# Patient Record
Sex: Female | Born: 1980
Health system: Southern US, Community
[De-identification: ages and names within clinical notes are randomized; demographics above are authoritative.]

## PROBLEM LIST (undated history)

## (undated) DIAGNOSIS — J45909 Unspecified asthma, uncomplicated: Secondary | ICD-10-CM

## (undated) DIAGNOSIS — N200 Calculus of kidney: Secondary | ICD-10-CM

## (undated) DIAGNOSIS — G43909 Migraine, unspecified, not intractable, without status migrainosus: Secondary | ICD-10-CM

## (undated) DIAGNOSIS — E669 Obesity, unspecified: Secondary | ICD-10-CM

## (undated) DIAGNOSIS — Z8759 Personal history of other complications of pregnancy, childbirth and the puerperium: Secondary | ICD-10-CM

## (undated) HISTORY — DX: Migraine, unspecified, not intractable, without status migrainosus: G43.909

## (undated) HISTORY — DX: Personal history of other complications of pregnancy, childbirth and the puerperium: Z87.59

---

## 2004-11-07 DIAGNOSIS — Z8759 Personal history of other complications of pregnancy, childbirth and the puerperium: Secondary | ICD-10-CM

## 2004-11-07 HISTORY — PX: SALPINGOOPHORECTOMY: SHX82

## 2004-11-07 HISTORY — DX: Personal history of other complications of pregnancy, childbirth and the puerperium: Z87.59

## 2005-08-01 ENCOUNTER — Encounter: Payer: Self-pay | Admitting: Emergency Medicine

## 2005-08-01 ENCOUNTER — Inpatient Hospital Stay (HOSPITAL_COMMUNITY): Admission: AD | Admit: 2005-08-01 | Discharge: 2005-08-03 | Payer: Self-pay | Admitting: Gynecology

## 2005-08-01 ENCOUNTER — Encounter (INDEPENDENT_AMBULATORY_CARE_PROVIDER_SITE_OTHER): Payer: Self-pay | Admitting: *Deleted

## 2006-02-16 ENCOUNTER — Emergency Department (HOSPITAL_COMMUNITY): Admission: EM | Admit: 2006-02-16 | Discharge: 2006-02-16 | Payer: Self-pay | Admitting: Emergency Medicine

## 2006-03-01 ENCOUNTER — Emergency Department (HOSPITAL_COMMUNITY): Admission: EM | Admit: 2006-03-01 | Discharge: 2006-03-01 | Payer: Self-pay | Admitting: Emergency Medicine

## 2006-07-24 ENCOUNTER — Emergency Department (HOSPITAL_COMMUNITY): Admission: EM | Admit: 2006-07-24 | Discharge: 2006-07-24 | Payer: Self-pay | Admitting: Emergency Medicine

## 2006-10-13 ENCOUNTER — Emergency Department (HOSPITAL_COMMUNITY): Admission: EM | Admit: 2006-10-13 | Discharge: 2006-10-13 | Payer: Self-pay | Admitting: Emergency Medicine

## 2007-01-02 ENCOUNTER — Emergency Department (HOSPITAL_COMMUNITY): Admission: EM | Admit: 2007-01-02 | Discharge: 2007-01-02 | Payer: Self-pay | Admitting: Emergency Medicine

## 2007-01-05 ENCOUNTER — Emergency Department (HOSPITAL_COMMUNITY): Admission: EM | Admit: 2007-01-05 | Discharge: 2007-01-06 | Payer: Self-pay | Admitting: Emergency Medicine

## 2007-08-14 ENCOUNTER — Emergency Department (HOSPITAL_COMMUNITY): Admission: EM | Admit: 2007-08-14 | Discharge: 2007-08-14 | Payer: Self-pay | Admitting: Emergency Medicine

## 2007-10-24 ENCOUNTER — Emergency Department (HOSPITAL_COMMUNITY): Admission: EM | Admit: 2007-10-24 | Discharge: 2007-10-24 | Payer: Self-pay | Admitting: *Deleted

## 2008-09-27 ENCOUNTER — Emergency Department (HOSPITAL_COMMUNITY): Admission: EM | Admit: 2008-09-27 | Discharge: 2008-09-27 | Payer: Self-pay | Admitting: Emergency Medicine

## 2009-10-09 ENCOUNTER — Emergency Department (HOSPITAL_COMMUNITY): Admission: EM | Admit: 2009-10-09 | Discharge: 2009-10-09 | Payer: Self-pay | Admitting: Emergency Medicine

## 2009-12-22 ENCOUNTER — Ambulatory Visit: Payer: Self-pay | Admitting: Family Medicine

## 2009-12-22 DIAGNOSIS — G43009 Migraine without aura, not intractable, without status migrainosus: Secondary | ICD-10-CM | POA: Insufficient documentation

## 2009-12-22 DIAGNOSIS — F17200 Nicotine dependence, unspecified, uncomplicated: Secondary | ICD-10-CM

## 2010-03-01 ENCOUNTER — Ambulatory Visit: Payer: Self-pay | Admitting: Family Medicine

## 2010-03-01 ENCOUNTER — Encounter: Payer: Self-pay | Admitting: Family Medicine

## 2010-03-01 ENCOUNTER — Other Ambulatory Visit: Admission: RE | Admit: 2010-03-01 | Discharge: 2010-03-01 | Payer: Self-pay | Admitting: Family Medicine

## 2010-03-01 LAB — CONVERTED CEMR LAB
ALT: 25 units/L (ref 0–35)
CO2: 23 meq/L (ref 19–32)
Calcium: 8.8 mg/dL (ref 8.4–10.5)
Cholesterol: 149 mg/dL (ref 0–200)
Glucose, Bld: 97 mg/dL (ref 70–99)
HDL: 56 mg/dL (ref >39)
Potassium: 4.1 meq/L (ref 3.5–5.3)
Sodium: 139 meq/L (ref 135–145)
Total Bilirubin: 0.5 mg/dL (ref 0.3–1.2)
Triglycerides: 47 mg/dL (ref <150)
VLDL: 9 mg/dL (ref 0–40)

## 2010-03-22 ENCOUNTER — Telehealth: Payer: Self-pay | Admitting: Family Medicine

## 2010-04-27 ENCOUNTER — Telehealth: Payer: Self-pay | Admitting: Family Medicine

## 2010-12-07 NOTE — Progress Notes (Signed)
Summary: phn msg  Phone Note Call from Patient Call back at Home Phone [phone]   Caller: Patient Summary of Call: pt has her daughter and wanted to talk about her weight. has her for the summer. Initial call taken by: De Nurse,  April 27, 2010 3:08 PM  Follow-up for Phone Call        She would like for her daughter to be seen.  She is illiterate and cannot complete the paper work.  WIll schedule with me and I will help her. Follow-up by: Luretha Murphy NP,  April 27, 2010 3:28 PM
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
## 2010-12-07 NOTE — Progress Notes (Signed)
Summary: test results  Phone Note Call from Patient Call back at Home Phone [phone]   Caller: Patient Summary of Call: IS WANTING TEST RESULTS Initial call taken by: De Nurse,  Mar 22, 2010 3:02 PM  Follow-up for Phone Call        told her all are fine. use condoms to keep it that way gave her other labs as well. told her they were fine as well pap next year Follow-up by: Golden Circle RN,  Mar 22, 2010 3:03 PM

## 2010-12-07 NOTE — Assessment & Plan Note (Signed)
Summary: CPE WITH PAP/KH   Vital Signs:  Patient profile:   30 year old female Height:      61.5 inches Weight:      162 pounds BMI:     30.22 Pulse rate:   58 / minute BP sitting:   124 / 79  (right arm)  Vitals Entered By: Renato Battles slade,cma CC: physical with pap. std check. Is Patient Diabetic? No Pain Assessment Patient in pain? no        CC:  physical with pap. std check..  History of Present Illness: Here for CPE and PAP, history of abnormal PAP in past.  Boyfriend for several years.  Callouses on bottom of feet, she removes with a razor, painful.  She walks a lot.  Has regular menses, tubes removed after tubal pregnancy.   Did not pick up Naprosyn because Medicaid did not cover.  Migranes have been less frequent.  Habits & Providers  Alcohol-Tobacco-Diet     Tobacco Status: current     Tobacco Counseling: to quit use of tobacco products  Comments: 3 cigars daily  Current Medications (verified): 1)  Ibuprofen 800 Mg Tabs (Ibuprofen) .... One At The Onset of A Ha and Q 8 Hours Prn  Allergies (verified): No Known Drug Allergies  Review of Systems GI:  Denies abdominal pain. GU:  Denies discharge and dysuria. Derm:  callouses and spots on feet.  Physical Exam  General:  In no diestress Eyes:  pupils equal, pupils round, pupils reactive to light, no injection, and no optic disk abnormalities.   Ears:  R ear normal and L ear normal.   Nose:  External nasal examination shows no deformity or inflammation. Nasal mucosa are pink and moist without lesions or exudates. Mouth:  pharynx pink and moist and poor dentition.   Neck:  No deformities, masses, or tenderness noted. Breasts:  No mass, nodules, thickening, tenderness, bulging, retraction, inflamation, nipple discharge or skin changes noted.   Lungs:  normal respiratory effort and normal breath sounds.   Heart:  normal rate and regular rhythm.   Abdomen:  soft, non-tender, normal bowel sounds, and no  masses.   Genitalia:  normal introitus, no external lesions, no vaginal discharge, mucosa pink and moist, no vaginal or cervical lesions, normal uterus size and position, and no adnexal masses or tenderness.   Skin:  dark spots on soles of feet, thick callouses on balls of both feet. Psych:  not anxious appearing and not depressed appearing.     Impression & Recommendations:  Problem # 1:  CONTACT OR EXPOSURE TO OTHER VIRAL DISEASES (ICD-V01.79) plantar rash check RPR Orders: GC/Chlamydia-[facility] ([id number]) HIV-[facility] ([address]) RPR-[facility] [phone]) [facility]- Est  Level 4 (29562)  Problem # 2:  SCREENING, DIABETES MELLITUS (ICD-V77.1)  Orders: Comp Met-[facility] (13086-57846)  Problem # 3:  SCREENING FOR MALIGNANT NEOPLASM OF THE CERVIX (ICD-V76.2)  Orders: Pap Smear-[facility] ([id number]) [facility]- Est  Level 4 (24401)  Problem # 4:  SCREENING OTHER&UNSPEC CARDIOVASCULAR CONDITIONS (ICD-V81.2)  Orders: Comp Met-[facility] ([id number]) Lipid-[facility] (03474-25956) [facility]- Est  Level 4 (38756)  Problem # 5:  TOBACCO ABUSE (ICD-305.1)  counseled  Orders: [facility]- Est  Level 4 ([address]329)  Complete Medication List: 1)  Ibuprofen 800 Mg Tabs (Ibuprofen) .... One at the onset of a ha and q 8 hours prn  Patient Instructions: 1)  keep feet soft with vasoline and pair down callouses regularly 2)  Ibuprofen 800 mg for migranes 3)  Please schedule a follow-up appointment in 6 months .  Prescriptions:  IBUPROFEN 800 MG TABS (IBUPROFEN) one at the onset of a HA and q 8 hours prn  #50 x 3   Entered and Authorized by:   Luretha Murphy NP   Signed by:   Luretha Murphy NP on 03/01/2010   Method used:   Electronically to        [facility] [address] [phone]* (retail)       [address] [place], [place]  [address]       Ph: [phone]       Fax: [phone]   RxID:   [id number]

## 2010-12-15 ENCOUNTER — Encounter: Payer: Self-pay | Admitting: *Deleted

## 2011-03-08 ENCOUNTER — Encounter: Payer: Self-pay | Admitting: Family Medicine

## 2011-03-08 ENCOUNTER — Ambulatory Visit (INDEPENDENT_AMBULATORY_CARE_PROVIDER_SITE_OTHER): Payer: Medicaid Other | Admitting: Family Medicine

## 2011-03-08 DIAGNOSIS — N760 Acute vaginitis: Secondary | ICD-10-CM | POA: Insufficient documentation

## 2011-03-08 DIAGNOSIS — J4520 Mild intermittent asthma, uncomplicated: Secondary | ICD-10-CM | POA: Insufficient documentation

## 2011-03-08 DIAGNOSIS — G43009 Migraine without aura, not intractable, without status migrainosus: Secondary | ICD-10-CM

## 2011-03-08 DIAGNOSIS — M545 Low back pain: Secondary | ICD-10-CM

## 2011-03-08 DIAGNOSIS — Z20828 Contact with and (suspected) exposure to other viral communicable diseases: Secondary | ICD-10-CM

## 2011-03-08 LAB — POCT WET PREP (WET MOUNT): Trichomonas Wet Prep HPF POC: NEGATIVE

## 2011-03-08 MED ORDER — CYCLOBENZAPRINE HCL 10 MG PO TABS: 10.0000 mg | ORAL_TABLET | Freq: Three times a day (TID) | ORAL | Status: AC | PRN

## 2011-03-08 MED ORDER — NAPROXEN SODIUM 220 MG PO TABS: 220.0000 mg | ORAL_TABLET | Freq: Two times a day (BID) | ORAL | Status: AC

## 2011-03-08 MED ORDER — ALBUTEROL SULFATE HFA 108 (90 BASE) MCG/ACT IN AERS: 2.0000 | INHALATION_SPRAY | Freq: Four times a day (QID) | RESPIRATORY_TRACT | Status: DC | PRN

## 2011-03-08 NOTE — Patient Instructions (Signed)
For you memory: you need to use your brain more, that means reading and recalling what your read. If you had Alzheimers (this does not happen to 30 year old) but you would not be able to learn anything new, like how to use a cell phone. Listen to the news and talk about it with your fiance You have had learning problems your whole life, so you have work harder that others to remember  Your back exam does not indicate any disc or nerve damage but you have muscle weakness and  Tenderness.   I have prescribed a muscle relaxant to use with your Aleve, it may make you sleepy, so use at night. I also want you to do the exercise that I gave you this will help  Headaches:  Lets try using the muscle relaxant at night.    Please try and drink more and eating healthy

## 2011-03-09 ENCOUNTER — Encounter: Payer: Self-pay | Admitting: Family Medicine

## 2011-03-09 LAB — GC/CHLAMYDIA PROBE AMP, GENITAL: GC Probe Amp, Genital: NEGATIVE

## 2011-03-09 MED ORDER — METRONIDAZOLE 500 MG PO TABS: 500.0000 mg | ORAL_TABLET | Freq: Two times a day (BID) | ORAL | Status: AC

## 2011-03-09 NOTE — Assessment & Plan Note (Signed)
Non reactive RPR (spots on feet) and HIV

## 2011-03-09 NOTE — Assessment & Plan Note (Signed)
Musculoskeletal, poor muscle tone.  Trail of Flexeril for both LBP and HA, as this is structurally similar to tricyclic antidepressants.

## 2011-03-09 NOTE — Assessment & Plan Note (Signed)
Refilled albuterol 

## 2011-03-09 NOTE — Progress Notes (Signed)
  Subjective:    Patient ID: Debra Washington, female    DOB: 1981-01-28, 30 y.o.   MRN: [id number]  HPI Debra Washington is here for a complete physical, she will be moving to [place] to live with her boyfriend.  She is a new patient to our clinic in the past year and this is her only second visit.  Review of electronic records from the hospital reveal a bilateral salpingectomy in 2006 after an ectopic pregnancy.    Her boyfriend called the clinic staff and is worried about her memory.  Debra Washington reports learning problems her whole life and in special classes.  She has very low reading and writing literacy.  She also complains of back pain, that sometimes keeps her awake at night.  She take 2 Aleve and it helps, she does not exercise.  She also reports odd remedies, such that she takes bleach baths' after her period. She uses alcohol in her vaginal area to get rid of smells.  She has a diet of processed foods and drinks sugar laden beverages.    Review of Systems  Constitutional: Negative for activity change, appetite change and fatigue.  HENT: Negative for rhinorrhea.   Eyes: Negative for visual disturbance.  Respiratory: Negative for cough.   Cardiovascular: Negative for chest pain and leg swelling.  Gastrointestinal: Positive for constipation.  Genitourinary: Positive for vaginal discharge. Negative for dysuria and pelvic pain.  Musculoskeletal: Positive for back pain.  Neurological: Positive for headaches.  Psychiatric/Behavioral: Positive for decreased concentration. Negative for sleep disturbance, dysphoric mood and agitation. The patient is not nervous/anxious.        Objective:   Physical Exam  Constitutional: She is oriented to person, place, and time.       Alert, able to remember 3 simple objects in 5 minutes, unable to read a patient education handout.  HENT:  Right Ear: External ear normal.  Left Ear: External ear normal.  Mouth/Throat: Oropharynx is clear and moist.  Neck:  Normal range of motion. No thyromegaly present.  Cardiovascular: Normal rate, regular rhythm and normal heart sounds.   Pulmonary/Chest: Effort normal and breath sounds normal.  Abdominal: Soft. Bowel sounds are normal.  Genitourinary: Uterus normal. Vaginal discharge found.  Musculoskeletal:       Good ROM of lumbar spine, complained of discomfort with hyperextension.  Negative straight leg raises, 5/5 strength +2 DTR.  Lymphadenopathy:    She has no cervical adenopathy.  Neurological: She is alert and oriented to person, place, and time. She has normal reflexes. No cranial nerve deficit. Coordination normal.  Skin: Skin is warm and dry. No rash noted.          Assessment & Plan:

## 2011-03-09 NOTE — Assessment & Plan Note (Signed)
Had a normal PAP last year, will defer to 2 year intervals.  + BV will treat with oral metronidazole.  Instructed not to use bleach or alcohol in her vagina.

## 2011-03-09 NOTE — Assessment & Plan Note (Signed)
Will continue NSAIDS and add muscle relaxant at night with tricyclic properties.

## 2011-03-09 NOTE — Assessment & Plan Note (Signed)
Counseled to quit 

## 2011-03-25 NOTE — Discharge Summary (Signed)
Debra Washington, Debra Washington             ACCOUNT NO.:  [id number]   MEDICAL RECORD NO.:  [id number]          PATIENT TYPE:  INP   LOCATION:  9306                          FACILITY:  [facility]   PHYSICIAN:  Timothy P. Fontaine, M.D.DATE OF BIRTH:  02-11-1981   DATE OF ADMISSION:  08/01/2005  DATE OF DISCHARGE:  08/03/2005                                 DISCHARGE SUMMARY   DISCHARGE DIAGNOSES:  Right tubal ectopic pregnancy.   PATHOLOGY:  [id number]  Right fallopian; fallopian tube with chorionic villi  consistent with ectopic pregnancy, complete transection of fallopian tube.  Left fallopian tube; complete transection of fallopian tube. No pathologic  abnormalities.   PROCEDURE:  Exploratory laparotomy, bilateral salpingectomy, lysis of pelvic  adhesions August 01, 2005 Dr. Reynaldo Minium.   HOSPITAL COURSE:  The patient was admitted for the above procedure August 01, 2005. Her postoperative course was uncomplicated. She was discharged on  postoperative day #2 ambulating well, tolerating a regular diet with a  postoperative hemoglobin at 10.5. The patient received a prescription for  Lortab 7.5/500 #30 one to two p.o. q. 4-6 hours p.r.n. pain and Reglan 10  milligrams #25 one p.o. q.6 hours p.r.n. nausea, vomiting. The patient  received precautions, instructions and follow-up and will be seen in the  office the Friday following discharge for staple removal. Her blood type was  A positive.      Timothy P. Fontaine, M.D.  Electronically Signed     TPF/MEDQ  D:  08/31/2005  T:  08/31/2005  Job:  [id number]

## 2011-03-25 NOTE — Op Note (Signed)
Debra Washington, Debra Washington             ACCOUNT NO.:  [id number]   MEDICAL RECORD NO.:  [id number]          PATIENT TYPE:  INP   LOCATION:  9306                          [facility]ILITY:  [facility]   PHYSICIAN:  Juan H. Lily Peer, M.D.DATE OF BIRTH:  [birth year]   DATE OF PROCEDURE:  08/01/2005  DATE OF DISCHARGE:                                 OPERATIVE REPORT   INDICATIONS:  A 30 year old, gravida 4, para 3, last menstrual period August  of 2006.  The patient with previous tubal sterilization procedure in [place], [place] in January of this year.  The patient with 1-2-day  history of right lower abdominal pain.  Positive pregnancy test in the  emergency room.  Ultrasound description suspicious for a right ruptured  ectopic pregnancy, quantitative beta hCG 290,000,000 international units per  mL.  The ultrasound demonstrated right cystic area in the right adnexa but  no intrauterine pregnancy.   PREOPERATIVE DIAGNOSES:  1.  Ruptured right ectopic pregnancy.  2.  Prior tubal sterilization procedure.   POSTOPERATIVE DIAGNOSES:  1.  Hemoperitoneum.  2.  Right ampullary ectopic.   ANESTHESIA:  General endotracheal anesthesia.   SURGEON:  Juan H. Lily Peer, M.D.   OPERATION/PROCEDURE:  1.  Exploratory laparotomy.  2.  Bilateral salpingectomy.  3.  Lysis of pelvic adhesions.   FINDINGS:  There was blood in the pelvic cavity. Bilateral peritubal  adhesions near the area of the right tube and ovary.  There was blood clots  suspicious for an extruded ampullary ectopic pregnancy.  Evidence of  bilateral tubal sterilization bilaterally.   DESCRIPTION OF PROCEDURE:  After the patient was adequately counseled, she  was taken to the operating room where she underwent general endotracheal  anesthesia.  The patient's abdomen was prepped and draped in the usual  sterile fashion.  Foley was inserted in an effort to monitor urinary output.  A Pfannenstiel skin incision was made 2 cm above the  symphysis pubis.  The  incision was carried down through the skin and subcutaneous tissue down to  the rectus fascia.  A midline nick was made.  The patient was incised in  transverse fashion. The peritoneal cavity was entered cautiously.  The  O'Connor-O'Sullivan retractor was in place.  After the patient had been  placed in Trendelenburg position.  Blood and blood clots were removed from  the pelvic cavity.  The pelvic cavity was irrigated with normal saline  solution for better evaluation.  It appeared that the patient had an  extruded ampullary ectopic pregnancy and evidence of bilateral tubal  transection was evident.  Both ovaries otherwise looked normal.  The uterine  serosa anterior and posteriorly looked normal.  The patient underwent  bilateral salpingectomies by removing both fallopian tubes and the  mesosalpinx had been secured with 3-0 Vicryl suture on both sides.  The  pelvic cavity was copiously irrigated with normal saline solution.  Interceed was placed over both salpingectomy sites for prevention of  additional adhesions.  Of note, the small paraovarian adhesions had to be  free from the tube in an effort complete the salpingectomies bilaterally.  After  sponge count and needle count were correct, the O'Connor-O'Sullivan  retractor was removed.  The pelvic cavity had previously been irrigated with  normal saline solution and the patient was received 1 g of Ancef for  prophylaxis.  The visceral peritoneum was not reapproximated but the rectus  fascia was closed with a running stitch of 0 Vicryl suture.  The  subcutaneous bleeders were Bovie cauterized. The skin was reapproximated  with skin clips followed by placement of Xeroform gauze and 4 x 8 dressing.  The patient was extubated and transferred to the recovery room with stable  vital signs.  Blood loss was 100 mL.  IV fluid was 3000 mL lactated  Ringer's.  Urine output 150 mL.  The patient's blood type is A  positive.      Juan H. Lily Peer, M.D.  Electronically Signed     JHF/MEDQ  D:  08/01/2005  T:  08/01/2005  Job:  [id number]

## 2012-05-31 ENCOUNTER — Emergency Department (HOSPITAL_COMMUNITY)
Admission: EM | Admit: 2012-05-31 | Discharge: 2012-05-31 | Disposition: A | Discharge status: Home / Self Care | Payer: No Typology Code available for payment source | Attending: Emergency Medicine | Admitting: Emergency Medicine

## 2012-05-31 ENCOUNTER — Encounter (HOSPITAL_COMMUNITY): Payer: Self-pay | Admitting: *Deleted

## 2012-05-31 DIAGNOSIS — F17200 Nicotine dependence, unspecified, uncomplicated: Secondary | ICD-10-CM | POA: Insufficient documentation

## 2012-05-31 DIAGNOSIS — S335XXA Sprain of ligaments of lumbar spine, initial encounter: Secondary | ICD-10-CM | POA: Insufficient documentation

## 2012-05-31 DIAGNOSIS — IMO0002 Reserved for concepts with insufficient information to code with codable children: Secondary | ICD-10-CM

## 2012-05-31 HISTORY — DX: Unspecified asthma, uncomplicated: J45.909

## 2012-05-31 LAB — URINALYSIS, ROUTINE W REFLEX MICROSCOPIC
Bilirubin Urine: NEGATIVE
Glucose, UA: NEGATIVE mg/dL
Hgb urine dipstick: NEGATIVE
Ketones, ur: NEGATIVE mg/dL
Protein, ur: NEGATIVE mg/dL
Urobilinogen, UA: 1 mg/dL (ref 0.0–1.0)

## 2012-05-31 LAB — PREGNANCY, URINE: Preg Test, Ur: NEGATIVE

## 2012-05-31 MED ORDER — IBUPROFEN 600 MG PO TABS: 600.0000 mg | ORAL_TABLET | Freq: Four times a day (QID) | ORAL | Status: AC | PRN

## 2012-05-31 MED ORDER — CYCLOBENZAPRINE HCL 10 MG PO TABS: 10.0000 mg | ORAL_TABLET | Freq: Two times a day (BID) | ORAL | Status: AC | PRN
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
## 2012-05-31 NOTE — ED Provider Notes (Signed)
Medical screening examination/treatment/procedure(s) were performed by non-physician practitioner and as supervising physician I was immediately available for consultation/collaboration.   Avrian Delfavero Y. Mao Lockner, MD 05/31/12 2324 

## 2012-05-31 NOTE — ED Notes (Signed)
Pt ambulated to and from RR

## 2012-05-31 NOTE — ED Notes (Signed)
Provider at bedside
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
## 2013-12-03 ENCOUNTER — Encounter (HOSPITAL_COMMUNITY): Payer: Self-pay | Admitting: Emergency Medicine

## 2013-12-03 ENCOUNTER — Emergency Department (HOSPITAL_COMMUNITY)
Admission: EM | Admit: 2013-12-03 | Discharge: 2013-12-03 | Disposition: A | Discharge status: Home / Self Care | Payer: Medicare Other | Attending: Emergency Medicine | Admitting: Emergency Medicine

## 2013-12-03 DIAGNOSIS — J45909 Unspecified asthma, uncomplicated: Secondary | ICD-10-CM | POA: Insufficient documentation

## 2013-12-03 DIAGNOSIS — Z8742 Personal history of other diseases of the female genital tract: Secondary | ICD-10-CM | POA: Insufficient documentation

## 2013-12-03 DIAGNOSIS — F17200 Nicotine dependence, unspecified, uncomplicated: Secondary | ICD-10-CM | POA: Insufficient documentation

## 2013-12-03 DIAGNOSIS — Z880 Allergy status to penicillin: Secondary | ICD-10-CM | POA: Insufficient documentation

## 2013-12-03 DIAGNOSIS — Z79899 Other long term (current) drug therapy: Secondary | ICD-10-CM | POA: Insufficient documentation

## 2013-12-03 DIAGNOSIS — R21 Rash and other nonspecific skin eruption: Secondary | ICD-10-CM | POA: Insufficient documentation

## 2013-12-03 MED ORDER — PREDNISONE 20 MG PO TABS
60.0000 mg | ORAL_TABLET | Freq: Once | ORAL | Status: AC
  Administered 2013-12-03: 60 mg via ORAL
  Filled 2013-12-03: qty 3

## 2013-12-03 MED ORDER — PERMETHRIN 5 % EX CREA: TOPICAL_CREAM | CUTANEOUS | Status: DC

## 2013-12-03 NOTE — ED Provider Notes (Signed)
CSN: [id number]     Arrival date & time 12/03/13  1818 History  This chart was scribed for non-physician practitioner Ivonne AndrewPeter Khalel Alms, PA-C working with Audree CamelScott T Goldston, MD by Valera CastleSteven Perry, ED scribe. This patient was seen in room WTR7/WTR7 and the patient's care was started at 9:19 PM.     Chief Complaint  Patient presents with  . Rash    The history is provided by the patient. No language interpreter was used.   HPI Comments: Debra Washington is a 33 y.o. female who presents to the Emergency Department complaining of an itchy rash over her upper back, neck, bilateral arms, hands fingers, onset a few days ago. She reports she just came down from [place] visiting family. She reports not eating much yesterday, doesn't think rash is due to something she ate. She reports staying with family at their house. She reports her son has a few bumps over his body as well. She reports she slept on couch, on sheets. She states son slept in a different room. She reports h/o sensitive skin. She reports having forgotten her dove soap, so she used sister's [place] soap. She reports her skin was burning when she used [place] soap. She reports having Benadryl at home, but not having used it. She denies h/o similar rash. She denies SOB, swelling of lips, mouth, tongue, and any other associated symptoms. She denies medical history.   PCP - No primary provider on file.  Past Medical History  Diagnosis Date  . Hx of ectopic pregnancy 2006  . Asthma    Past Surgical History  Procedure Laterality Date  . Salpingoophorectomy  2006   History reviewed. No pertinent family history. History  Substance Use Topics  . Smoking status: Current Every Day Smoker  . Smokeless tobacco: Not on file  . Alcohol Use: No   OB History   Grav Para Term Preterm Abortions TAB SAB Ect Mult Living                 Review of Systems  Constitutional: Negative for fever.  HENT: Negative for facial swelling.   Respiratory:  Negative for shortness of breath.   Skin: Positive for rash (upper back, neck, bilateral arms, hands, fingers).  All other systems reviewed and are negative.   Allergies  Penicillins  Home Medications   Current Outpatient Rx  Name  Route  Sig  Dispense  Refill  . albuterol (PROVENTIL HFA;VENTOLIN HFA) 108 (90 BASE) MCG/ACT inhaler   Inhalation   Inhale 2 puffs into the lungs every 4 (four) hours as needed for wheezing or shortness of breath. For shortness of breath         . cyclobenzaprine (FLEXERIL) 10 MG tablet   Oral   Take 10 mg by mouth daily as needed for muscle spasms (For back spasms).         Marland Kitchen. ibuprofen (ADVIL,MOTRIN) 800 MG tablet   Oral   Take 800-1,600 mg by mouth 2 (two) times daily as needed for mild pain or moderate pain.          BP 133/92  Pulse 79  Temp(Src) 97.6 F (36.4 C) (Oral)  Resp 16  SpO2 93%  Physical Exam  Nursing note and vitals reviewed. Constitutional: She is oriented to person, place, and time. She appears well-developed and well-nourished. No distress.  HENT:  Head: Normocephalic and atraumatic.  Eyes: EOM are normal.  Neck: Neck supple.  Cardiovascular: Normal rate, regular rhythm and normal heart sounds.  No murmur heard. Pulmonary/Chest: Effort normal. No respiratory distress. She has no wheezes. She has no rales.  Musculoskeletal: Normal range of motion.  Neurological: She is alert and oriented to person, place, and time.  Skin: Skin is warm and dry. Rash noted.  Grouped primarily papular rash over left upper chest, lower anterior neck, bilateral forearms and upper arms, and dorsal hands. Patient scratching at many areas.  Psychiatric: She has a normal mood and affect. Her behavior is normal.    ED Course  Procedures   DIAGNOSTIC STUDIES: Oxygen Saturation is 93% on room air, adequate by my interpretation.    COORDINATION OF CARE: 9:23 PM-Discussed treatment plan which includes allergic reaction symptoms with pt at  bedside and pt agreed to plan. Advised pt to f/u if symptoms worsen.     MDM   1. Rash       I personally performed the services described in this documentation, which was scribed in my presence. The recorded information has been reviewed and is accurate.    Angus Seller, PA-C 12/04/13 [phone]

## 2013-12-03 NOTE — ED Notes (Signed)
Pt reports rash on her body x2 days. Denies pain. Pt is from out of town. Thought it was the soap she has been using.

## 2013-12-03 NOTE — Discharge Instructions (Signed)
Continue to use Benadryl for her rash and itching. If you continue to have symptoms or have worsening rash you may consider using the cream that was prescribed. Vacuum all furniture and wash all linens and clothing in hot water. Shower and apply the cream and leave on for 10-14 hours then wash off. Do not use the cream again for another 7-10 days. Return for any changing or worsening symptoms. Follow up with a primary care provider.    Rash A rash is a change in the color or texture of your skin. There are many different types of rashes. You may have other problems that accompany your rash. CAUSES   Infections.  Allergic reactions. This can include allergies to pets or foods.  Certain medicines.  Exposure to certain chemicals, soaps, or cosmetics.  Heat.  Exposure to poisonous plants.  Tumors, both cancerous and noncancerous. SYMPTOMS   Redness.  Scaly skin.  Itchy skin.  Dry or cracked skin.  Bumps.  Blisters.  Pain. DIAGNOSIS  Your caregiver may do a physical exam to determine what type of rash you have. A skin sample (biopsy) may be taken and examined under a microscope. TREATMENT  Treatment depends on the type of rash you have. Your caregiver may prescribe certain medicines. For serious conditions, you may need to see a skin doctor (dermatologist). HOME CARE INSTRUCTIONS   Avoid the substance that caused your rash.  Do not scratch your rash. This can cause infection.  You may take cool baths to help stop itching.  Only take over-the-counter or prescription medicines as directed by your caregiver.  Keep all follow-up appointments as directed by your caregiver. SEEK IMMEDIATE MEDICAL CARE IF:  You have increasing pain, swelling, or redness.  You have a fever.  You have new or severe symptoms.  You have body aches, diarrhea, or vomiting.  Your rash is not better after 3 days. MAKE SURE YOU:  Understand these instructions.  Will watch your  condition.  Will get help right away if you are not doing well or get worse. Document Released: 10/14/2002 Document Revised: 01/16/2012 Document Reviewed: 08/08/2011 [facility] Patient Information 2014 [place] [place].

## 2013-12-03 NOTE — ED Notes (Signed)
Patient instructed to get any place or anyone that has come in contact with her to be treated.

## 2013-12-04 NOTE — ED Provider Notes (Signed)
Medical screening examination/treatment/procedure(s) were performed by non-physician practitioner and as supervising physician I was immediately available for consultation/collaboration.  EKG Interpretation   None         Audree CamelScott T Osten Janek, MD 12/04/13 1026

## 2015-03-25 ENCOUNTER — Encounter (HOSPITAL_COMMUNITY): Payer: Self-pay | Admitting: Emergency Medicine

## 2015-03-25 ENCOUNTER — Emergency Department (HOSPITAL_COMMUNITY)
Admission: EM | Admit: 2015-03-25 | Discharge: 2015-03-25 | Disposition: A | Discharge status: Home / Self Care | Payer: Medicare Other | Attending: Emergency Medicine | Admitting: Emergency Medicine

## 2015-03-25 DIAGNOSIS — Z880 Allergy status to penicillin: Secondary | ICD-10-CM | POA: Insufficient documentation

## 2015-03-25 DIAGNOSIS — Z720 Tobacco use: Secondary | ICD-10-CM | POA: Diagnosis not present

## 2015-03-25 DIAGNOSIS — K088 Other specified disorders of teeth and supporting structures: Secondary | ICD-10-CM | POA: Diagnosis present

## 2015-03-25 DIAGNOSIS — J45909 Unspecified asthma, uncomplicated: Secondary | ICD-10-CM | POA: Diagnosis not present

## 2015-03-25 DIAGNOSIS — Z79899 Other long term (current) drug therapy: Secondary | ICD-10-CM | POA: Insufficient documentation

## 2015-03-25 DIAGNOSIS — Z760 Encounter for issue of repeat prescription: Secondary | ICD-10-CM | POA: Diagnosis not present

## 2015-03-25 DIAGNOSIS — K029 Dental caries, unspecified: Secondary | ICD-10-CM | POA: Diagnosis not present

## 2015-03-25 MED ORDER — CLINDAMYCIN HCL 150 MG PO CAPS: 150.0000 mg | ORAL_CAPSULE | Freq: Four times a day (QID) | ORAL | Status: DC

## 2015-03-25 MED ORDER — IBUPROFEN 600 MG PO TABS: 600.0000 mg | ORAL_TABLET | Freq: Four times a day (QID) | ORAL | Status: DC | PRN

## 2015-03-25 MED ORDER — CYCLOBENZAPRINE HCL 10 MG PO TABS: 10.0000 mg | ORAL_TABLET | Freq: Two times a day (BID) | ORAL | Status: DC | PRN

## 2015-03-25 NOTE — ED Notes (Signed)
Pt c/o right lower and left upper dental pain. Has been taking muscle relaxers and Ibuprofen with alleviation of pain. Taking Flexeril for arthritis in back. Almost out of medication. Reports she is waiting for Medicaid to come in for her and her son. Says before she left [place] her "face was swollen up like a baseball and I don't want to get an infection." No facial swelling noted. No medications taken today.

## 2015-03-25 NOTE — ED Provider Notes (Signed)
CSN: [id number]     Arrival date & time 03/25/15  1644 History   This chart was scribed for non-physician practitioner Vangie BickerErin O' San MorelleMalley, PA-C, working with Purvis SheffieldForrest Harrison, MD, by Andrew Auaven Small, ED Scribe. This patient was seen in room WTR7/WTR7 and the patient's care was started at 5:25 PM. Chief Complaint  Patient presents with  . Dental Pain   The history is provided by the patient. No language interpreter was used.    Debra Washington is a 34 y.o. female who presents to the Emergency Department complaining of gradually worsening left lower and right upper  dental pain.  Pt reports she moved her from [place] 1-2 months ago and has had gradually worsening dental pain prior to leaving with some facial swelling. Pt reports sharp dental pain with some HA. Pain is constant, aching, 8/10.  She has taken ibuprofen and flexeril with minimal relief to pain. Requesting refill on her medications.  She currently does not have insurance at this time but is currently transitioning medical paperwork from [place]. Pt denies fever, nausea and emesis.   Past Medical History  Diagnosis Date  . Hx of ectopic pregnancy 2006  . Asthma    Past Surgical History  Procedure Laterality Date  . Salpingoophorectomy  2006   History reviewed. No pertinent family history. History  Substance Use Topics  . Smoking status: Current Every Day Smoker  . Smokeless tobacco: Not on file  . Alcohol Use: No   OB History    No data available     Review of Systems  Constitutional: Negative for fever and chills.  HENT: Positive for dental problem and facial swelling.   Gastrointestinal: Negative for nausea and vomiting.  All other systems reviewed and are negative.  Allergies  Penicillins  Home Medications   Prior to Admission medications   Medication Sig Start Date End Date Taking? Authorizing Provider  albuterol (PROVENTIL HFA;VENTOLIN HFA) 108 (90 BASE) MCG/ACT inhaler Inhale 2 puffs into the lungs every 4 (four)  hours as needed for wheezing or shortness of breath. For shortness of breath    Historical Provider, MD  clindamycin (CLEOCIN) 150 MG capsule Take 1 capsule (150 mg total) by mouth every 6 (six) hours. 03/25/15   Junius FinnerErin O'Malley, PA-C  cyclobenzaprine (FLEXERIL) 10 MG tablet Take 1 tablet (10 mg total) by mouth 2 (two) times daily as needed for muscle spasms (For back spasms). 03/25/15   Junius FinnerErin O'Malley, PA-C  ibuprofen (ADVIL,MOTRIN) 600 MG tablet Take 1 tablet (600 mg total) by mouth every 6 (six) hours as needed. 03/25/15   Junius FinnerErin O'Malley, PA-C  permethrin (ELIMITE) 5 % cream Apply to affected area once in the on for 14 hours then wash off 12/03/13   Ivonne AndrewPeter Dammen, PA-C   BP 139/93 mmHg  Pulse 66  Temp(Src) 97.9 F (36.6 C) (Oral)  Resp 16  SpO2 100%  LMP 03/18/2015 (Approximate) Physical Exam  Constitutional: She is oriented to person, place, and time. She appears well-developed and well-nourished. No distress.  HENT:  Head: Normocephalic and atraumatic.  Mouth/Throat: Uvula is midline, oropharynx is clear and moist and mucous membranes are normal. No trismus in the jaw. Abnormal dentition. Dental caries present. No dental abscesses. No oropharyngeal exudate, posterior oropharyngeal edema, posterior oropharyngeal erythema or tonsillar abscesses.  Several missing teeth, diffuse dental decay and dental caries. Right lower last molar tender to touch. No gingival abscess.   Eyes: Conjunctivae and EOM are normal.  Neck: Neck supple.  Cardiovascular: Normal rate.   Pulmonary/Chest:  Effort normal.  Musculoskeletal: Normal range of motion.  Neurological: She is alert and oriented to person, place, and time.  Skin: Skin is warm and dry.  Psychiatric: She has a normal mood and affect. Her behavior is normal.  Nursing note and vitals reviewed.   ED Course  Procedures (including critical care time) DIAGNOSTIC STUDIES: Oxygen Saturation is 100% on RA, normal by my interpretation.    COORDINATION OF  CARE: 5:33 PM- Pt advised of plan for treatment and pt agrees.  Labs Review Labs Reviewed - No data to display  Imaging Review No results found.   EKG Interpretation None      MDM   Final diagnoses:  Dental decay  Pain due to dental caries  Medication refill    Pt presenting to ED with c/o dental pain. Pt reports having a dental infection about 1-2 months ago while in [place]. Pt moved to [place] area April 2nd. She is waiting for medicaid and does not have a PCP yet.   No gingival abscess requiring I&D at this time. Pt allergic to PCN. Will place on clindamycin. Recruitment consultantcommunity resource guide provided.  Home care instructions provided. Return precautions provided. Pt verbalized understanding and agreement with tx plan.   .I personally performed the services described in this documentation, which was scribed in my presence. The recorded information has been reviewed and is accurate.    Junius Finnerrin O'Malley, PA-C 03/25/15 1821  Purvis SheffieldForrest Harrison, MD 03/25/15 2350

## 2015-03-25 NOTE — Discharge Instructions (Signed)
°Emergency Department Resource Guide °1) Find a Doctor and Pay Out of Pocket °Although you won't have to find out who is covered by your insurance plan, it is a good idea to ask around and get recommendations. You will then need to call the office and see if the doctor you have chosen will accept you as a new patient and what types of options they offer for patients who are self-pay. Some doctors offer discounts or will set up payment plans for their patients who do not have insurance, but you will need to ask so you aren't surprised when you get to your appointment. ° °2) Contact Your Local Health Department °Not all health departments have doctors that can see patients for sick visits, but many do, so it is worth a call to see if yours does. If you don't know where your local health department is, you can check in your phone book. The CDC also has a tool to help you locate your state's health department, and many state websites also have listings of all of their local health departments. ° °3) Find a Walk-in Clinic °If your illness is not likely to be very severe or complicated, you may want to try a walk in clinic. These are popping up all over the country in pharmacies, drugstores, and shopping centers. They're usually staffed by nurse practitioners or physician assistants that have been trained to treat common illnesses and complaints. They're usually fairly quick and inexpensive. However, if you have serious medical issues or chronic medical problems, these are probably not your best option. ° °No Primary Care Doctor: °- Call [facility] at  832-8000 - they can help you locate a primary care doctor that  accepts your insurance, provides certain services, etc. °- Physician Referral Service- 1-800-533-3463 ° °Chronic Pain Problems: °Organization         Address  Phone   Notes  °[facility]  (336) 297-2271 Patients need to be referred by their primary care doctor.  ° °Medication  Assistance: °Organization         Address  Phone   Notes  °[place] [facility] [address] °[place], [place] [address] (336) 641-8030 --Must be a resident of [place] °-- Must have NO insurance coverage whatsoever (no Medicaid/ Medicare, etc.) °-- The pt. MUST have a primary care doctor that directs their care regularly and follows them in the community °  °[facility]  (866) 331-1348   °[employer]  (888) 892-1162   ° °Agencies that provide inexpensive medical care: °Organization         Address  Phone   Notes  °[facility]  (336) 832-8035   °[facility]    (336) 832-7272   °[facility] [address] °[place], [place] [address] (336) 832-4777   °[facility] [address], °[place] (336) 271-4999   °[facility]    (336) 373-0678   °[facility]    (336) 272-1050   °[facility] ° [address], [place] Phone:  (336) 832-4444, Fax:  (336) 832-4440 Hours of Operation:  9 am - 6 pm, M-F.  Also accepts Medicaid/Medicare and self-pay.  °[facility] ° [address],  Phone: (336) 832-3150, Fax: (336) 832-3151. Hours of Operation:  8:30 am - 5:30 pm, M-F.  Also accepts Medicaid and self-pay.  °[facility] [place] [address], [place] Phone: (336) 878-6027   °[facility] [address], [place], [place] (336)723-1848, Ext. 123 Mondays & Thursdays: 7-9 AM.  First 15 patients are seen on a first come, first serve basis. °  ° °Medicaid-accepting [place] Providers: ° °Organization         Address  Phone   Notes  °[facility] [address], [place] (336) 641-2100 Also accepts self-pay patients.  °[facility] [address], [place] ° (336) 856-9996   °[facility] [address], [place]  (336) 288-8857   °[facility] [address] [place] [address], [place] (336) 299-7000   °Veita Bland [address], [place]  ° (336) 373-1557 Only accepts  Access Medicaid patients after they have their name applied to their card.  ° °Self-Pay (no insurance) in [place]: ° °Organization         Address  Phone   Notes  °Sickle Cell Patients, [facility] [address], [place] (336) 832-1970   °[facility] [address], [place] (336) 832-4400   °[facility] ° [address], [place] (336) 992-4800   °[facility]/Dr. Osei-Bonsu ° [address] [place] [address], [place] or [address], [place] (336) 841-8500 Phone number for both [place] and [place] locations is the same.  °[facility] [address], [place] (336) 299-0000   °[facility] [address] [place] [address], [place] or [address] (336) 852-7530 °(336) 878-2260   °[facility] [address], [place] (336) 350-1642, phone; (336) 294-5005, fax Sees patients 1st and 3rd Saturday of every month.  Must not qualify for public or private insurance (i.e. Medicaid, Medicare, [place] Health Choice, Veterans' Benefits) • Household income should be no more than 200% of the poverty level •The clinic cannot treat you if you are pregnant or think you are pregnant • Sexually transmitted diseases are not treated at the clinic.  ° ° °Dental Care: °Organization         Address  Phone  Notes  °[place] [facility] [address], [place] (336) 641-6152 Accepts children up to age 21 who are enrolled in Medicaid or [place] Health Choice; pregnant women with a Medicaid card; and children who have applied for Medicaid or [place] Health Choice, but were declined, whose parents can pay a reduced fee at time of service.  °[place]  [facility] [place]  [address], [place] (336) 641-7733 Accepts children up to age 21 who are enrolled in Medicaid or [place] Health Choice; pregnant women with a Medicaid card; and children who have applied for Medicaid or [place] Health Choice, but were declined, whose parents can pay a reduced fee at time of service.  °[facility] ° [address],  (336) 641-4533 Patients are seen by appointment only. Walk-ins are not accepted. [facility] will see patients 18 years of age and older. °Monday - Tuesday (8am-5pm) °Most Wednesdays (8:30-5pm) °$30 per visit, cash only  °[facility] ° [address], [place] (336) 641-4533 Patients are seen by appointment only. Walk-ins are not accepted. [facility] will see patients 18 years of age and older. °One   Wednesday Evening (Monthly: Volunteer Based).  $30 per visit, cash only  °[facility]  (919) 537-3737 for adults; Children under age 4, call Graduate Pediatric Dentistry at (919) [phone]. Children aged 4-14, please call (919) [phone] to request a pediatric application. ° Dental services are provided in all areas of dental care including fillings, crowns and bridges, complete and partial dentures, implants, gum treatment, root canals, and extractions. Preventive care is also provided. Treatment is provided to both adults and children. °Patients are selected via a lottery and there is often a waiting list. °  °[facility] [address], °[place] ° (336) 763-8833 [email or web address] °  °[facility] [address], [place], [place] (336)723-1848, Ext. 123 Second and Fourth Thursday of each month, opens at 6:30 AM; Clinic ends at 9 AM.  Patients are seen on a first-come first-served basis, and a limited number are seen during each clinic.  ° °[facility] ° [address], [place], [place] (336) 723-7904    Eligibility Requirements °You must have lived in [place], [place], or [place] counties for at least the last three months. °  You cannot be eligible for state or federal sponsored healthcare insurance, including Veterans Administration, Medicaid, or Medicare. °  You generally cannot be eligible for healthcare insurance through your employer.  °  How to apply: °Eligibility screenings are held every Tuesday and Wednesday afternoon from 1:00 pm until 4:00 pm. You do not need an appointment for the interview!  °[facility] [address], [place], [place] 336-631-2330   °[facility]  336-342-8273   °[facility]  336-703-3100   °[facility]  336-570-6415   ° °Behavioral Health Resources in the Community: °Intensive Outpatient Programs °Organization         Address  Phone  Notes  °[facility] [address], [place], [place] 336-878-6098   °[facility] [address], [place], [place] 336-832-9800   °[facility] [address], [place], [place] ° 336-882-2125   °[facility] [address],  °[place], [place] 1-800-853-5163 or 336-641-4981   °Substance Abuse Resources °Organization         Address  Phone  Notes  °[facility]  336-882-2125   °[facility]  336-784-9470   °[facility]  336-285-9073   °[facility]  336-845-3988   °Residential & Outpatient Substance Abuse Program  1-800-659-3381   °Psychological Services °Organization         Address  Phone  Notes  °[facility]  336- 832-9600   °[facility]  336- 378-7881   °[facility] [address], [place] 1-800-853-5163 or 336-641-4981   ° °Mobile Crisis Teams °Organization         Address  Phone  Notes  °[facility], Mobile Crisis Care Unit  1-877-626-1772   °Assertive °[facility] ° [address]  [place], [place] 336-834-9664   °Sharon DeEsch [address] °[place] 336-554-5454   ° °Self-Help/Support Groups °Organization         Address  Phone             Notes  °[facility] - variety of support groups  336- 373-1402 Call for more information  °Narcotics Anonymous (NA), [facility] [address], °[place]  2 meetings at this location  ° °  Residential Treatment Programs °Organization         Address  Phone  Notes  °[facility] [address],    °[place]  1-866-801-8205   °[facility] ° [address], [place], [place] 704-293-8524   °[facility] [address], [place] 336-845-3988 Admissions: 8am-3pm M-F  °[facility] [address]    °[place], [place] 336-841-1104   °[facility] [address], [place], [place] 336-379-7146   °[facility] [address]  °[place], [place] 336-285-9073   °[facility] - Intensive Outpatient [address], [place], [place] 336-852-3033   °[facility] [address]  °[place], [place] 1-877-615-2722 or 336-784-9470   °[facility] [address] [place], [place] 336-227-7417 Accepts Medicaid  °[facility] [address]  ° [place] 1-800-659-3381 Substance Abuse/Addiction Treatment  ° °[place] Behavioral Health Resources °Organization         Address  Phone  Notes  °[facility]  (888) 581-9988   °Julie Brannon, PhD [address] [place], [place]   (336) 349-5553 or (336) 951-0000   °[facility]   [address] °[place], [place] (336) 349-4454   °[facility] [address], [place], [place] (336) 342-8316 Insurance/Medicaid/sponsorship through [facility]  °[facility] [address] [place]                                    [place], [place] (336) 342-8316 Therapy/tele-psych/case    °[facility] [address]  ° [place], [place] (336) 349-2233    °Dr. Arfeen  (336) 349-4544   °[facility] [place]  [employer] [place] [facility] 1) [address], [place] °2) [address], [place] °3)  [address], [place] (336) 349-3220 °(336) 342-7768 ° °(336) 342-8140   °[place] [facility] (336) 342-1394 or (336) 342-3537 (After Hours)    ° ° °

## 2017-01-11 DIAGNOSIS — I10 Essential (primary) hypertension: Secondary | ICD-10-CM | POA: Diagnosis not present

## 2017-01-11 DIAGNOSIS — R202 Paresthesia of skin: Secondary | ICD-10-CM | POA: Diagnosis not present

## 2017-01-11 DIAGNOSIS — Z720 Tobacco use: Secondary | ICD-10-CM | POA: Diagnosis not present

## 2017-01-11 DIAGNOSIS — R7303 Prediabetes: Secondary | ICD-10-CM | POA: Diagnosis not present

## 2017-01-11 DIAGNOSIS — J45909 Unspecified asthma, uncomplicated: Secondary | ICD-10-CM | POA: Diagnosis not present

## 2017-01-11 DIAGNOSIS — M549 Dorsalgia, unspecified: Secondary | ICD-10-CM | POA: Diagnosis not present

## 2017-01-11 DIAGNOSIS — J45998 Other asthma: Secondary | ICD-10-CM | POA: Diagnosis not present

## 2017-01-11 DIAGNOSIS — E559 Vitamin D deficiency, unspecified: Secondary | ICD-10-CM | POA: Diagnosis not present

## 2017-01-24 DIAGNOSIS — Z720 Tobacco use: Secondary | ICD-10-CM | POA: Diagnosis not present

## 2017-01-24 DIAGNOSIS — J45909 Unspecified asthma, uncomplicated: Secondary | ICD-10-CM | POA: Diagnosis not present

## 2017-01-24 DIAGNOSIS — I10 Essential (primary) hypertension: Secondary | ICD-10-CM | POA: Diagnosis not present

## 2017-01-24 DIAGNOSIS — J069 Acute upper respiratory infection, unspecified: Secondary | ICD-10-CM | POA: Diagnosis not present

## 2017-01-24 DIAGNOSIS — E559 Vitamin D deficiency, unspecified: Secondary | ICD-10-CM | POA: Diagnosis not present

## 2017-01-24 DIAGNOSIS — M549 Dorsalgia, unspecified: Secondary | ICD-10-CM | POA: Diagnosis not present

## 2017-01-24 DIAGNOSIS — R7303 Prediabetes: Secondary | ICD-10-CM | POA: Diagnosis not present

## 2017-05-15 DIAGNOSIS — R7303 Prediabetes: Secondary | ICD-10-CM | POA: Diagnosis not present

## 2017-05-15 DIAGNOSIS — Z720 Tobacco use: Secondary | ICD-10-CM | POA: Diagnosis not present

## 2017-05-15 DIAGNOSIS — M549 Dorsalgia, unspecified: Secondary | ICD-10-CM | POA: Diagnosis not present

## 2017-05-15 DIAGNOSIS — I10 Essential (primary) hypertension: Secondary | ICD-10-CM | POA: Diagnosis not present

## 2017-05-15 DIAGNOSIS — N6452 Nipple discharge: Secondary | ICD-10-CM | POA: Diagnosis not present

## 2017-05-15 DIAGNOSIS — E559 Vitamin D deficiency, unspecified: Secondary | ICD-10-CM | POA: Diagnosis not present

## 2017-05-15 DIAGNOSIS — J45909 Unspecified asthma, uncomplicated: Secondary | ICD-10-CM | POA: Diagnosis not present

## 2017-06-05 DIAGNOSIS — I10 Essential (primary) hypertension: Secondary | ICD-10-CM | POA: Diagnosis not present

## 2017-06-05 DIAGNOSIS — Z136 Encounter for screening for cardiovascular disorders: Secondary | ICD-10-CM | POA: Diagnosis not present

## 2017-06-05 DIAGNOSIS — R202 Paresthesia of skin: Secondary | ICD-10-CM | POA: Diagnosis not present

## 2017-06-05 DIAGNOSIS — Z720 Tobacco use: Secondary | ICD-10-CM | POA: Diagnosis not present

## 2017-06-05 DIAGNOSIS — H539 Unspecified visual disturbance: Secondary | ICD-10-CM | POA: Diagnosis not present

## 2017-06-05 DIAGNOSIS — Z131 Encounter for screening for diabetes mellitus: Secondary | ICD-10-CM | POA: Diagnosis not present

## 2017-06-05 DIAGNOSIS — J45909 Unspecified asthma, uncomplicated: Secondary | ICD-10-CM | POA: Diagnosis not present

## 2017-06-05 DIAGNOSIS — R7303 Prediabetes: Secondary | ICD-10-CM | POA: Diagnosis not present

## 2017-06-05 DIAGNOSIS — E559 Vitamin D deficiency, unspecified: Secondary | ICD-10-CM | POA: Diagnosis not present

## 2017-06-05 DIAGNOSIS — M549 Dorsalgia, unspecified: Secondary | ICD-10-CM | POA: Diagnosis not present

## 2017-06-05 DIAGNOSIS — Z0110 Encounter for examination of ears and hearing without abnormal findings: Secondary | ICD-10-CM | POA: Diagnosis not present

## 2017-06-05 DIAGNOSIS — Z113 Encounter for screening for infections with a predominantly sexual mode of transmission: Secondary | ICD-10-CM | POA: Diagnosis not present

## 2017-06-05 DIAGNOSIS — B079 Viral wart, unspecified: Secondary | ICD-10-CM | POA: Diagnosis not present

## 2017-06-07 DIAGNOSIS — M71572 Other bursitis, not elsewhere classified, left ankle and foot: Secondary | ICD-10-CM | POA: Diagnosis not present

## 2017-06-07 DIAGNOSIS — M2041 Other hammer toe(s) (acquired), right foot: Secondary | ICD-10-CM | POA: Diagnosis not present

## 2017-06-07 DIAGNOSIS — M71571 Other bursitis, not elsewhere classified, right ankle and foot: Secondary | ICD-10-CM | POA: Diagnosis not present

## 2017-06-07 DIAGNOSIS — M2042 Other hammer toe(s) (acquired), left foot: Secondary | ICD-10-CM | POA: Diagnosis not present

## 2017-06-07 DIAGNOSIS — M79675 Pain in left toe(s): Secondary | ICD-10-CM | POA: Diagnosis not present

## 2017-06-07 DIAGNOSIS — M79674 Pain in right toe(s): Secondary | ICD-10-CM | POA: Diagnosis not present

## 2017-06-30 DIAGNOSIS — M71571 Other bursitis, not elsewhere classified, right ankle and foot: Secondary | ICD-10-CM | POA: Diagnosis not present

## 2017-06-30 DIAGNOSIS — M71572 Other bursitis, not elsewhere classified, left ankle and foot: Secondary | ICD-10-CM | POA: Diagnosis not present

## 2017-07-11 ENCOUNTER — Emergency Department (HOSPITAL_COMMUNITY)
Admission: EM | Admit: 2017-07-11 | Discharge: 2017-07-11 | Disposition: A | Discharge status: Home / Self Care | Payer: Medicare Other | Attending: Emergency Medicine | Admitting: Emergency Medicine

## 2017-07-11 ENCOUNTER — Emergency Department (HOSPITAL_COMMUNITY): Payer: Medicare Other

## 2017-07-11 ENCOUNTER — Encounter (HOSPITAL_COMMUNITY): Payer: Self-pay | Admitting: Emergency Medicine

## 2017-07-11 DIAGNOSIS — Y999 Unspecified external cause status: Secondary | ICD-10-CM | POA: Insufficient documentation

## 2017-07-11 DIAGNOSIS — S99911A Unspecified injury of right ankle, initial encounter: Secondary | ICD-10-CM | POA: Diagnosis present

## 2017-07-11 DIAGNOSIS — S93401A Sprain of unspecified ligament of right ankle, initial encounter: Secondary | ICD-10-CM | POA: Diagnosis not present

## 2017-07-11 DIAGNOSIS — Y929 Unspecified place or not applicable: Secondary | ICD-10-CM | POA: Insufficient documentation

## 2017-07-11 DIAGNOSIS — Y939 Activity, unspecified: Secondary | ICD-10-CM | POA: Insufficient documentation

## 2017-07-11 DIAGNOSIS — M25571 Pain in right ankle and joints of right foot: Secondary | ICD-10-CM | POA: Diagnosis not present

## 2017-07-11 DIAGNOSIS — X58XXXA Exposure to other specified factors, initial encounter: Secondary | ICD-10-CM | POA: Insufficient documentation

## 2017-07-11 DIAGNOSIS — J45909 Unspecified asthma, uncomplicated: Secondary | ICD-10-CM | POA: Diagnosis not present

## 2017-07-11 DIAGNOSIS — F17200 Nicotine dependence, unspecified, uncomplicated: Secondary | ICD-10-CM | POA: Diagnosis not present

## 2017-07-11 MED ORDER — IBUPROFEN 800 MG PO TABS: 800.0000 mg | ORAL_TABLET | Freq: Three times a day (TID) | ORAL | 0 refills | Status: DC

## 2017-07-11 NOTE — ED Triage Notes (Signed)
Pt reports falling off her hover board on Saturday, injuring right ankle, pain and swelling present.

## 2017-07-11 NOTE — ED Provider Notes (Signed)
[facility]EMERGENCY DEPT Provider Note   CSN: [id number] Arrival date & time: 07/11/17  [id number]     History   Chief Complaint Chief Complaint  Patient presents with  . Ankle Pain    HPI Debra Washington is a 36 y.o. female.  The history is provided by the patient. No language interpreter was used.  Ankle Pain   Incident onset: 4 days ago. There was no injury mechanism. The pain is present in the right ankle. The pain is moderate. The pain has been constant since onset. Pertinent negatives include no inability to bear weight and no loss of sensation. She reports no foreign bodies present. Nothing aggravates the symptoms. She has tried nothing for the symptoms. The treatment provided no relief.    Past Medical History:  Diagnosis Date  . Asthma   . Hx of ectopic pregnancy 2006    Patient Active Problem List   Diagnosis Date Noted  . Vaginitis and vulvovaginitis 03/08/2011  . Exposure to viral disease 03/08/2011  . Low back pain 03/08/2011  . Asthma, intermittent 03/08/2011  . TOBACCO ABUSE 12/22/2009  . COMMON MIGRAINE 12/22/2009    Past Surgical History:  Procedure Laterality Date  . SALPINGOOPHORECTOMY  2006    OB History    No data available       Home Medications    Prior to Admission medications   Medication Sig Start Date End Date Taking? Authorizing Provider  albuterol (PROVENTIL HFA;VENTOLIN HFA) 108 (90 BASE) MCG/ACT inhaler Inhale 2 puffs into the lungs every 4 (four) hours as needed for wheezing or shortness of breath. For shortness of breath    [provider]  clindamycin (CLEOCIN) 150 MG capsule Take 1 capsule (150 mg total) by mouth every 6 (six) hours. 03/25/15   Lurene ShadowPhelps, Erin O, PA-C  cyclobenzaprine (FLEXERIL) 10 MG tablet Take 1 tablet (10 mg total) by mouth 2 (two) times daily as needed for muscle spasms (For back spasms). 03/25/15   Lurene ShadowPhelps, Erin O, PA-C  ibuprofen (ADVIL,MOTRIN) 600 MG tablet Take 1 tablet (600 mg total) by mouth every 6 (six)  hours as needed. 03/25/15   Lurene ShadowPhelps, Erin O, PA-C  permethrin (ELIMITE) 5 % cream Apply to affected area once in the on for 14 hours then wash off 12/03/13   Ivonne Andrewammen, Peter, PA-C    Family History No family history on file.  Social History Social History  Substance Use Topics  . Smoking status: Current Every Day Smoker  . Smokeless tobacco: Not on file  . Alcohol use No     Allergies   Penicillins   Review of Systems Review of Systems  Musculoskeletal: Positive for joint swelling and myalgias.  All other systems reviewed and are negative.    Physical Exam Updated Vital Signs BP 124/77   Pulse 81   Temp 97.7 F (36.5 C) (Oral)   Resp 17   Ht 5\' 1"  (1.549 m)   Wt 84.4 kg (186 lb)   LMP 07/10/2017   SpO2 98%   BMI 35.14 kg/m   Physical Exam  Constitutional: She appears well-developed and well-nourished.  HENT:  Head: Normocephalic.  Musculoskeletal: Normal range of motion.  Tender right ankle, from  nv and ns intact   Neurological: She is alert.  Skin: Skin is warm.  Psychiatric: She has a normal mood and affect.  Nursing note and vitals reviewed.    ED Treatments / Results  Labs (all labs ordered are listed, but only abnormal results are displayed) Labs Reviewed -  No data to display  EKG  EKG Interpretation None       Radiology Dg Ankle Complete Right  Result Date: 07/11/2017 CLINICAL DATA:  Initial encounter for All over right ankle pain and swelling x3 days s/p falling injury. Pt states she was riding a hoverboard and it moved out from under her while she attempted to park it. She twisted her ankle during the fall. No hx of right ankle injuries or surgeries. EXAM: RIGHT ANKLE - COMPLETE 3+ VIEW COMPARISON:  None. FINDINGS: Bimalleolar soft tissue swelling, greater laterally. No acute fracture or dislocation. Base of fifth metatarsal and talar dome intact. IMPRESSION: Soft tissue swelling only. Electronically Signed   By: Jeronimo Greaves M.D.   On:  07/11/2017 10:45    Procedures Procedures (including critical care time)  Medications Ordered in ED Medications - No data to display   Initial Impression / Assessment and Plan / ED Course  I have reviewed the triage vital signs and the nursing notes.  Pertinent labs & imaging results that were available during my care of the patient were reviewed by me and considered in my medical decision making (see chart for details).       Final Clinical Impressions(s) / ED Diagnoses   Final diagnoses:  Acute right ankle pain    New Prescriptions New Prescriptions   IBUPROFEN (ADVIL,MOTRIN) 800 MG TABLET    Take 1 tablet (800 mg total) by mouth 3 (three) times daily.   An After Visit Summary was printed and given to the patient.   Elson Areas, Cordelia Poche 07/11/17 1130    Doug Sou, MD 07/11/17 [id number]

## 2017-07-11 NOTE — Discharge Instructions (Signed)
Return if any problems.

## 2017-08-30 DIAGNOSIS — M549 Dorsalgia, unspecified: Secondary | ICD-10-CM | POA: Diagnosis not present

## 2017-08-30 DIAGNOSIS — E559 Vitamin D deficiency, unspecified: Secondary | ICD-10-CM | POA: Diagnosis not present

## 2017-08-30 DIAGNOSIS — R202 Paresthesia of skin: Secondary | ICD-10-CM | POA: Diagnosis not present

## 2017-08-30 DIAGNOSIS — J45909 Unspecified asthma, uncomplicated: Secondary | ICD-10-CM | POA: Diagnosis not present

## 2017-08-30 DIAGNOSIS — Z720 Tobacco use: Secondary | ICD-10-CM | POA: Diagnosis not present

## 2017-08-30 DIAGNOSIS — I10 Essential (primary) hypertension: Secondary | ICD-10-CM | POA: Diagnosis not present

## 2017-08-30 DIAGNOSIS — R7303 Prediabetes: Secondary | ICD-10-CM | POA: Diagnosis not present

## 2017-09-05 DIAGNOSIS — R202 Paresthesia of skin: Secondary | ICD-10-CM | POA: Diagnosis not present

## 2017-09-25 DIAGNOSIS — G5601 Carpal tunnel syndrome, right upper limb: Secondary | ICD-10-CM | POA: Diagnosis not present

## 2017-10-23 DIAGNOSIS — R030 Elevated blood-pressure reading, without diagnosis of hypertension: Secondary | ICD-10-CM | POA: Diagnosis not present

## 2017-10-23 DIAGNOSIS — Z6836 Body mass index (BMI) 36.0-36.9, adult: Secondary | ICD-10-CM | POA: Diagnosis not present

## 2017-10-23 DIAGNOSIS — G5600 Carpal tunnel syndrome, unspecified upper limb: Secondary | ICD-10-CM | POA: Diagnosis not present

## 2017-12-21 DIAGNOSIS — E669 Obesity, unspecified: Secondary | ICD-10-CM | POA: Diagnosis not present

## 2017-12-21 DIAGNOSIS — I10 Essential (primary) hypertension: Secondary | ICD-10-CM | POA: Diagnosis not present

## 2017-12-21 DIAGNOSIS — Z0000 Encounter for general adult medical examination without abnormal findings: Secondary | ICD-10-CM | POA: Diagnosis not present

## 2017-12-21 DIAGNOSIS — Z720 Tobacco use: Secondary | ICD-10-CM | POA: Diagnosis not present

## 2017-12-21 DIAGNOSIS — E559 Vitamin D deficiency, unspecified: Secondary | ICD-10-CM | POA: Diagnosis not present

## 2017-12-21 DIAGNOSIS — J45909 Unspecified asthma, uncomplicated: Secondary | ICD-10-CM | POA: Diagnosis not present

## 2017-12-21 DIAGNOSIS — R7303 Prediabetes: Secondary | ICD-10-CM | POA: Diagnosis not present

## 2017-12-21 DIAGNOSIS — G629 Polyneuropathy, unspecified: Secondary | ICD-10-CM | POA: Diagnosis not present

## 2017-12-21 DIAGNOSIS — M549 Dorsalgia, unspecified: Secondary | ICD-10-CM | POA: Diagnosis not present

## 2018-01-05 HISTORY — PX: CARPAL TUNNEL RELEASE: SHX101

## 2018-01-31 DIAGNOSIS — G5601 Carpal tunnel syndrome, right upper limb: Secondary | ICD-10-CM | POA: Diagnosis not present

## 2018-02-26 DIAGNOSIS — E669 Obesity, unspecified: Secondary | ICD-10-CM | POA: Diagnosis not present

## 2018-02-26 DIAGNOSIS — J45909 Unspecified asthma, uncomplicated: Secondary | ICD-10-CM | POA: Diagnosis not present

## 2018-02-26 DIAGNOSIS — M549 Dorsalgia, unspecified: Secondary | ICD-10-CM | POA: Diagnosis not present

## 2018-02-26 DIAGNOSIS — Z720 Tobacco use: Secondary | ICD-10-CM | POA: Diagnosis not present

## 2018-02-26 DIAGNOSIS — I10 Essential (primary) hypertension: Secondary | ICD-10-CM | POA: Diagnosis not present

## 2018-02-26 DIAGNOSIS — E559 Vitamin D deficiency, unspecified: Secondary | ICD-10-CM | POA: Diagnosis not present

## 2018-02-26 DIAGNOSIS — R7303 Prediabetes: Secondary | ICD-10-CM | POA: Diagnosis not present

## 2018-02-26 DIAGNOSIS — G629 Polyneuropathy, unspecified: Secondary | ICD-10-CM | POA: Diagnosis not present

## 2018-04-18 DIAGNOSIS — R7303 Prediabetes: Secondary | ICD-10-CM | POA: Diagnosis not present

## 2018-04-18 DIAGNOSIS — E669 Obesity, unspecified: Secondary | ICD-10-CM | POA: Diagnosis not present

## 2018-04-18 DIAGNOSIS — I10 Essential (primary) hypertension: Secondary | ICD-10-CM | POA: Diagnosis not present

## 2018-04-18 DIAGNOSIS — Z5181 Encounter for therapeutic drug level monitoring: Secondary | ICD-10-CM | POA: Diagnosis not present

## 2018-04-18 DIAGNOSIS — J45909 Unspecified asthma, uncomplicated: Secondary | ICD-10-CM | POA: Diagnosis not present

## 2018-04-18 DIAGNOSIS — M549 Dorsalgia, unspecified: Secondary | ICD-10-CM | POA: Diagnosis not present

## 2018-04-18 DIAGNOSIS — G629 Polyneuropathy, unspecified: Secondary | ICD-10-CM | POA: Diagnosis not present

## 2018-04-18 DIAGNOSIS — Z720 Tobacco use: Secondary | ICD-10-CM | POA: Diagnosis not present

## 2018-04-18 DIAGNOSIS — G43101 Migraine with aura, not intractable, with status migrainosus: Secondary | ICD-10-CM | POA: Diagnosis not present

## 2018-04-18 DIAGNOSIS — E559 Vitamin D deficiency, unspecified: Secondary | ICD-10-CM | POA: Diagnosis not present

## 2018-05-02 ENCOUNTER — Encounter: Payer: Self-pay | Admitting: Neurology

## 2018-05-02 ENCOUNTER — Ambulatory Visit (INDEPENDENT_AMBULATORY_CARE_PROVIDER_SITE_OTHER): Payer: Medicare Other | Admitting: Neurology

## 2018-05-02 VITALS — BP 135/86 | HR 87 | Ht 61.0 in | Wt 203.0 lb

## 2018-05-02 DIAGNOSIS — R51 Headache with orthostatic component, not elsewhere classified: Secondary | ICD-10-CM

## 2018-05-02 DIAGNOSIS — H9190 Unspecified hearing loss, unspecified ear: Secondary | ICD-10-CM

## 2018-05-02 DIAGNOSIS — G43009 Migraine without aura, not intractable, without status migrainosus: Secondary | ICD-10-CM

## 2018-05-02 DIAGNOSIS — I671 Cerebral aneurysm, nonruptured: Secondary | ICD-10-CM | POA: Diagnosis not present

## 2018-05-02 DIAGNOSIS — H539 Unspecified visual disturbance: Secondary | ICD-10-CM

## 2018-05-02 DIAGNOSIS — H93A2 Pulsatile tinnitus, left ear: Secondary | ICD-10-CM

## 2018-05-02 DIAGNOSIS — R519 Headache, unspecified: Secondary | ICD-10-CM

## 2018-05-02 MED ORDER — RIZATRIPTAN BENZOATE 10 MG PO TBDP: 10.0000 mg | ORAL_TABLET | ORAL | 11 refills | Status: DC | PRN

## 2018-05-02 MED ORDER — ALPRAZOLAM 0.25 MG PO TABS: ORAL_TABLET | ORAL | 0 refills | Status: DC

## 2018-05-02 NOTE — Patient Instructions (Addendum)
Maxalt(Rizatriptan): Please take one tablet at the onset of your headache. If it does not improve the symptoms please take one additional tablet. Do not take more then 2 tablets in 24hrs. Do not take use more then 2 to 3 times in a week.  MRI of the brain and look at the blood vessels Lab today   Migraine Headache A migraine headache is an intense, throbbing pain on one side or both sides of the head. Migraines may also cause other symptoms, such as nausea, vomiting, and sensitivity to light and noise. What are the causes? Doing or taking certain things may also trigger migraines, such as:  Alcohol.  Smoking.  Medicines, such as: ? Medicine used to treat chest pain (nitroglycerine). ? Birth control pills. ? Estrogen pills. ? Certain blood pressure medicines.  Aged cheeses, chocolate, or caffeine.  Foods or drinks that contain nitrates, glutamate, aspartame, or tyramine.  Physical activity.  Other things that may trigger a migraine include:  Menstruation.  Pregnancy.  Hunger.  Stress, lack of sleep, too much sleep, or fatigue.  Weather changes.  What increases the risk? The following factors may make you more likely to experience migraine headaches:  Age. Risk increases with age.  Family history of migraine headaches.  Being Caucasian.  Depression and anxiety.  Obesity.  Being a woman.  Having a hole in the heart (patent foramen ovale) or other heart problems.  What are the signs or symptoms? The main symptom of this condition is pulsating or throbbing pain. Pain may:  Happen in any area of the head, such as on one side or both sides.  Interfere with daily activities.  Get worse with physical activity.  Get worse with exposure to bright lights or loud noises.  Other symptoms may include:  Nausea.  Vomiting.  Dizziness.  General sensitivity to bright lights, loud noises, or smells.  Before you get a migraine, you may get warning signs that a  migraine is developing (aura). An aura may include:  Seeing flashing lights or having blind spots.  Seeing bright spots, halos, or zigzag lines.  Having tunnel vision or blurred vision.  Having numbness or a tingling feeling.  Having trouble talking.  Having muscle weakness.  How is this diagnosed? A migraine headache can be diagnosed based on:  Your symptoms.  A physical exam.  Tests, such as CT scan or MRI of the head. These imaging tests can help rule out other causes of headaches.  Taking fluid from the spine (lumbar puncture) and analyzing it (cerebrospinal fluid analysis, or CSF analysis).  How is this treated? A migraine headache is usually treated with medicines that:  Relieve pain.  Relieve nausea.  Prevent migraines from coming back.  Treatment may also include:  Acupuncture.  Lifestyle changes like avoiding foods that trigger migraines.  Follow these instructions at home: Medicines  Take over-the-counter and prescription medicines only as told by your health care provider.  Do not drive or use heavy machinery while taking prescription pain medicine.  To prevent or treat constipation while you are taking prescription pain medicine, your health care provider may recommend that you: ? Drink enough fluid to keep your urine clear or pale yellow. ? Take over-the-counter or prescription medicines. ? Eat foods that are high in fiber, such as fresh fruits and vegetables, whole grains, and beans. ? Limit foods that are high in fat and processed sugars, such as fried and sweet foods. Lifestyle  Avoid alcohol use.  Do not use any  products that contain nicotine or tobacco, such as cigarettes and e-cigarettes. If you need help quitting, ask your health care provider.  Get at least 8 hours of sleep every night.  Limit your stress. General instructions   Keep a journal to find out what may trigger your migraine headaches. For example, write down: ? What you  eat and drink. ? How much sleep you get. ? Any change to your diet or medicines.  If you have a migraine: ? Avoid things that make your symptoms worse, such as bright lights. ? It may help to lie down in a dark, quiet room. ? Do not drive or use heavy machinery. ? Ask your health care provider what activities are safe for you while you are experiencing symptoms.  Keep all follow-up visits as told by your health care provider. This is important. Contact a health care provider if:  You develop symptoms that are different or more severe than your usual migraine symptoms. Get help right away if:  Your migraine becomes severe.  You have a fever.  You have a stiff neck.  You have vision loss.  Your muscles feel weak or like you cannot control them.  You start to lose your balance often.  You develop trouble walking.  You faint. This information is not intended to replace advice given to you by your health care provider. Make sure you discuss any questions you have with your health care provider. Document Released: 10/24/2005 Document Revised: 05/13/2016 Document Reviewed: 04/11/2016 Elsevier Interactive Patient Education  2017 Elsevier Inc.    Rizatriptan tablets What is this medicine? RIZATRIPTAN (rye za TRIP tan) is used to treat migraines with or without aura. An aura is a strange feeling or visual disturbance that warns you of an attack. It is not used to prevent migraines. This medicine may be used for other purposes; ask your health care provider or pharmacist if you have questions. COMMON BRAND NAME(S): Maxalt What should I tell my health care provider before I take this medicine? They need to know if you have any of these conditions: -bowel disease or colitis -diabetes -family history of heart disease -fast or irregular heart beat -heart or blood vessel disease, angina (chest pain), or previous heart attack -high blood pressure -high cholesterol -history of  stroke, transient ischemic attacks (TIAs or mini-strokes), or intracranial bleeding -kidney or liver disease -overweight -poor circulation -postmenopausal or surgical removal of uterus and ovaries -Raynaud's disease -seizure disorder -an unusual or allergic reaction to rizatriptan, other medicines, foods, dyes, or preservatives -pregnant or trying to get pregnant -breast-feeding How should I use this medicine? This medicine is taken by mouth with a glass of water. Follow the directions on the prescription label. This medicine is taken at the first symptoms of a migraine. It is not for everyday use. If your migraine headache returns after one dose, you can take another dose as directed. You must leave at least 2 hours between doses, and do not take more than 30 mg total in 24 hours. If there is no improvement at all after the first dose, do not take a second dose without talking to your doctor or health care professional. Do not take your medicine more often than directed. Talk to your pediatrician regarding the use of this medicine in children. While this drug may be prescribed for children as young as 6 years for selected conditions, precautions do apply. Overdosage: If you think you have taken too much of this medicine contact a poison  control center or emergency room at once. NOTE: This medicine is only for you. Do not share this medicine with others. What if I miss a dose? This does not apply; this medicine is not for regular use. What may interact with this medicine? Do not take this medicine with any of the following medicines: -amphetamine, dextroamphetamine or cocaine -dihydroergotamine, ergotamine, ergoloid mesylates, methysergide, or ergot-type medication - do not take within 24 hours of taking rizatriptan -feverfew -MAOIs like Carbex, Eldepryl, Marplan, Nardil, and Parnate - do not take rizatriptan within 2 weeks of stopping MAOI therapy. -other migraine medicines like almotriptan,  eletriptan, naratriptan, sumatriptan, zolmitriptan - do not take within 24 hours of taking rizatriptan -tryptophan This medicine may also interact with the following medications: -medicines for mental depression, anxiety or mood problems -propranolol This list may not describe all possible interactions. Give your health care provider a list of all the medicines, herbs, non-prescription drugs, or dietary supplements you use. Also tell them if you smoke, drink alcohol, or use illegal drugs. Some items may interact with your medicine. What should I watch for while using this medicine? Only take this medicine for a migraine headache. Take it if you get warning symptoms or at the start of a migraine attack. It is not for regular use to prevent migraine attacks. You may get drowsy or dizzy. Do not drive, use machinery, or do anything that needs mental alertness until you know how this medicine affects you. To reduce dizzy or fainting spells, do not sit or stand up quickly, especially if you are an older patient. Alcohol can increase drowsiness, dizziness and flushing. Avoid alcoholic drinks. Smoking cigarettes may increase the risk of heart-related side effects from using this medicine. If you take migraine medicines for 10 or more days a month, your migraines may get worse. Keep a diary of headache days and medicine use. Contact your healthcare professional if your migraine attacks occur more frequently. What side effects may I notice from receiving this medicine? Side effects that you should report to your doctor or health care professional as soon as possible: -allergic reactions like skin rash, itching or hives, swelling of the face, lips, or tongue -fast, slow, or irregular heart beat -increased or decreased blood pressure -seizures -severe stomach pain and cramping, bloody diarrhea -signs and symptoms of a blood clot such as breathing problems; changes in vision; chest pain; severe, sudden  headache; pain, swelling, warmth in the leg; trouble speaking; sudden numbness or weakness of the face, arm or leg -tingling, pain, or numbness in the face, hands, or feet Side effects that usually do not require medical attention (report to your doctor or health care professional if they continue or are bothersome): -drowsiness -dry mouth -feeling warm, flushing, or redness of the face -headache -muscle cramps, pain -nausea, vomiting -unusually weak or tired This list may not describe all possible side effects. Call your doctor for medical advice about side effects. You may report side effects to FDA at 1-800-FDA-1088. Where should I keep my medicine? Keep out of the reach of children. Store at room temperature between 15 and 30 degrees C (59 and 86 degrees F). Keep container tightly closed. Throw away any unused medicine after the expiration date. NOTE: This sheet is a summary. It may not cover all possible information. If you have questions about this medicine, talk to your doctor, pharmacist, or health care provider.  2018 Elsevier/Gold Standard (2013-06-25 10:16:39)

## 2018-05-02 NOTE — Progress Notes (Signed)
[facility]    Provider:  Dr Lucia GaskinsAhern Referring Provider: Jackie Plumsei-Bonsu, George, MD Primary Care Physician:  Jackie Plumsei-Bonsu, George, MD  CC:  Migraines  HPI:  Debra Washington is a 37 y.o. female here as a referral from Dr. Julio Sickssei-Bonsu for migraines. PMHx asthma, pre-diabetes, hypertension, migraines, GERD, arthritis, neuropathy, tobacco abuse, back pain, obesity. Migraines started when she was young I her 4420s, father had aneurysm, she doesn't know much about her family history mother died when she was [age]. Can start on the left and then spread, pounding/pulsating/throbbing, she has light and sound sensitivity, nausea no vomiting. Movement makes it worse, sitting still in a dark room and sleep helps. She has a pulsating ringing in her ear on the left. No vision changes with the headaches or otherwise. No fullness of ears. 7 headache days a month.Her migraines last several hours to 3 days. No aura. She can have 1-2 days of severe migraines a month and other days moderately severe. Ibuprofen helps. Side effects to prednisone. Ibuprofen occasionally no medication overuse.She does not want to take daily medicine. She has morning headaches.positional headaches. No other focal neurologic deficits, associated symptoms, inciting events or modifiable factors.  Reviewed notes, labs and imaging from outside physicians, which showed:  Reviewed referring physician's notes.  Patient has a history of migraines.  Migraines can last for up to 3 days.  Ibuprofen helps for the pain but the headache may come back.  Examination was unremarkable.  Diagnosed with migraine with and without aura and with status migrainosus not intractable. On flexeril.   Pregnancy labs negative, UDS negative, CBC normal, TSH normal, BMP unremarkable with BUN 13 and creatinine 1.02, hemoglobin A1c 5.4 labs were drawn December 21, 2017.  Review of Systems: Patient complains of symptoms per HPI as well as the following symptoms:  headache. Pertinent negatives and positives per HPI. All others negative.   Social History   Socioeconomic History  . Marital status: Single    Spouse name: Not on file  . Number of children: 3  . Years of education: Not on file  . Highest education level: Not on file  Occupational History  . Not on file  Social Needs  . Financial resource strain: Not on file  . Food insecurity:    Worry: Not on file    Inability: Not on file  . Transportation needs:    Medical: Not on file    Non-medical: Not on file  Tobacco Use  . Smoking status: Current Every Day Smoker    Types: Cigars  . Smokeless tobacco: Never Used  Substance and Sexual Activity  . Alcohol use: No  . Drug use: Not Currently    Comment: smoked weed 2 years ago  . Sexual activity: Yes    Partners: Male  Lifestyle  . Physical activity:    Days per week: Not on file    Minutes per session: Not on file  . Stress: Not on file  Relationships  . Social connections:    Talks on phone: Not on file    Gets together: Not on file    Attends religious service: Not on file    Active member of club or organization: Not on file    Attends meetings of clubs or organizations: Not on file    Relationship status: Not on file  . Intimate partner violence:    Fear of current or ex partner: Not on file    Emotionally abused: Not on file    Physically abused:  Not on file    Forced sexual activity: Not on file  Other Topics Concern  . Not on file  Social History Narrative   3 children, one in her care.      Lives at home with her son   Right handed    Family History  Problem Relation Age of Onset  . Migraines Mother   . Aneurysm Father   . Congestive Heart Failure Maternal Grandmother   . Hypertension Maternal Grandmother     Past Medical History:  Diagnosis Date  . Asthma   . Hx of ectopic pregnancy 2006  . Migraine     Past Surgical History:  Procedure Laterality Date  . CARPAL TUNNEL RELEASE Right 01/2018    . SALPINGOOPHORECTOMY  2006   pt not sure if her ovaries were removed     Current Outpatient Medications  Medication Sig Dispense Refill  . albuterol (PROVENTIL HFA;VENTOLIN HFA) 108 (90 BASE) MCG/ACT inhaler Inhale 2 puffs into the lungs every 4 (four) hours as needed for wheezing or shortness of breath. For shortness of breath    . cyclobenzaprine (FLEXERIL) 10 MG tablet Take 1 tablet (10 mg total) by mouth 2 (two) times daily as needed for muscle spasms (For back spasms). 10 tablet 0  . ibuprofen (ADVIL,MOTRIN) 800 MG tablet Take 1 tablet (800 mg total) by mouth 3 (three) times daily. (Patient taking differently: Take 800 mg by mouth 3 (three) times daily as needed. ) 21 tablet 0  . ALPRAZolam (XANAX) 0.25 MG tablet Take 1-2 tablets 30-60 minutes before procedure. May repeat if needed. Do not drive. 5 tablet 0  . predniSONE (DELTASONE) 20 MG tablet Take 20 mg by mouth daily. For one week  0  . rizatriptan (MAXALT-MLT) 10 MG disintegrating tablet Take 1 tablet (10 mg total) by mouth as needed for migraine. May repeat in 2 hours if needed 9 tablet 11   No current facility-administered medications for this visit.     Allergies as of 05/02/2018 - Review Complete 05/02/2018  Allergen Reaction Noted  . Penicillins Other (See Comments) 05/31/2012    Vitals: BP 135/86 (BP Location: Right Arm, Patient Position: Sitting)   Pulse 87   Ht 5\' 1"  (1.549 m)   Wt 203 lb (92.1 kg)   LMP 03/26/2018 (Approximate)   BMI 38.36 kg/m  Last Weight:  Wt Readings from Last 1 Encounters:  05/02/18 203 lb (92.1 kg)   Last Height:   Ht Readings from Last 1 Encounters:  05/02/18 5\' 1"  (1.549 m)   Physical exam: Exam: Gen: NAD, conversant, well nourised, obese, well groomed                     CV: RRR, no MRG. No Carotid Bruits. No peripheral edema, warm, nontender Eyes: Conjunctivae clear without exudates or hemorrhage  Neuro: Detailed Neurologic Exam  Speech:    Speech is normal; fluent and  spontaneous with normal comprehension.  Cognition:    The patient is oriented to person, place, and time;     recent and remote memory intact;     language fluent;     normal attention, concentration,     fund of knowledge Cranial Nerves:    The pupils are equal, round, and reactive to light. The fundi are normal and spontaneous venous pulsations are present. Visual fields are full to finger confrontation. Extraocular movements are intact. Trigeminal sensation is intact and the muscles of mastication are normal. The face is symmetric. The  palate elevates in the midline. Hearing intact. Voice is normal. Shoulder shrug is normal. The tongue has normal motion without fasciculations.   Coordination:    Normal finger to nose and heel to shin. Normal rapid alternating movements.   Gait:    Heel-toe and tandem gait are normal.   Motor Observation:    No asymmetry, no atrophy, and no involuntary movements noted. Tone:    Normal muscle tone.    Posture:    Posture is normal. normal erect    Strength:    Strength is V/V in the upper and lower limbs.      Sensation: intact to LT     Reflex Exam:  DTR's:    Deep tendon reflexes in the upper and lower extremities are normal bilaterally.   Toes:    The toes are downgoing bilaterally.   Clonus:    Clonus is absent.       Assessment/Plan:   37 y.o. female here as a referral from Dr. Julio Sicks for intractable headaches. PMHx asthma, pre-diabetes, hypertension, migraines, GERD, arthritis, neuropathy, tobacco abuse, back pain, obesity. Likely migraines but needs a thorough workup due to concerning symptoms of morning headaches, positional headaches, pulsatile tinnitus, Fhx ruptured aneurysm (Father died), vision changes.   MRI brain due to concerning symptoms of morning headaches, positional headaches,vision and hearing changes  to look for space occupying mass, chiari or intracranial hypertension (pseudotumor). MRA head to eval for  anurysms given FHx and pulsatile tinnitus Xanax before procedure Acute migraine management: Maxalt(Rizatriptan): Please take one tablet at the onset of your headache. If it does not improve the symptoms please take one additional tablet. Do not take more then 2 tablets in 24hrs. Do not take use more then 2 to 3 times in a week.  Orders Placed This Encounter  Procedures  . MR BRAIN W WO CONTRAST  . MR MRA HEAD WO CONTRAST  . Basic Metabolic Panel   Meds ordered this encounter  Medications  . rizatriptan (MAXALT-MLT) 10 MG disintegrating tablet    Sig: Take 1 tablet (10 mg total) by mouth as needed for migraine. May repeat in 2 hours if needed    Dispense:  9 tablet    Refill:  11  . ALPRAZolam (XANAX) 0.25 MG tablet    Sig: Take 1-2 tablets 30-60 minutes before procedure. May repeat if needed. Do not drive.    Dispense:  5 tablet    Refill:  0     Discussed: To prevent or relieve headaches, try the following: Cool Compress. Lie down and place a cool compress on your head.  Avoid headache triggers. If certain foods or odors seem to have triggered your migraines in the past, avoid them. A headache diary might help you identify triggers.  Include physical activity in your daily routine. Try a daily walk or other moderate aerobic exercise.  Manage stress. Find healthy ways to cope with the stressors, such as delegating tasks on your to-do list.  Practice relaxation techniques. Try deep breathing, yoga, massage and visualization.  Eat regularly. Eating regularly scheduled meals and maintaining a healthy diet might help prevent headaches. Also, drink plenty of fluids.  Follow a regular sleep schedule. Sleep deprivation might contribute to headaches Consider biofeedback. With this mind-body technique, you learn to control certain bodily functions - such as muscle tension, heart rate and blood pressure - to prevent headaches or reduce headache pain.    Proceed to emergency room if you  experience new or worsening symptoms  or symptoms do not resolve, if you have new neurologic symptoms or if headache is severe, or for any concerning symptom.   Provided education and documentation from American headache Society toolbox including articles on: chronic migraine medication overuse headache, chronic migraines, prevention of migraines, behavioral and other nonpharmacologic treatments for headache.  Cc: Jackie Plum, MD  Naomie Dean, MD  [facility] [address] [place], [place] [address]  Phone [phone] Fax [phone]

## 2018-05-03 ENCOUNTER — Telehealth: Payer: Self-pay | Admitting: *Deleted

## 2018-05-03 ENCOUNTER — Telehealth: Payer: Self-pay | Admitting: Neurology

## 2018-05-03 LAB — BASIC METABOLIC PANEL
BUN / CREAT RATIO: 16 (ref 9–23)
BUN: 15 mg/dL (ref 6–20)
CALCIUM: 9.6 mg/dL (ref 8.7–10.2)
CHLORIDE: 108 mmol/L — AB (ref 96–106)
CO2: 23 mmol/L (ref 20–29)
Creatinine, Ser: 0.92 mg/dL (ref 0.57–1.00)
GFR calc Af Amer: 91 mL/min/{1.73_m2} (ref >59)
GFR calc non Af Amer: 79 mL/min/{1.73_m2} (ref >59)
GLUCOSE: 73 mg/dL (ref 65–99)
POTASSIUM: 3.9 mmol/L (ref 3.5–5.2)
Sodium: 141 mmol/L (ref 134–144)

## 2018-05-03 NOTE — Telephone Encounter (Signed)
Medicare/medicaid order sent to [facility]. They will reach out to the pt to schedule.  °

## 2018-05-03 NOTE — Telephone Encounter (Signed)
-----   Message from Anson FretAntonia B Ahern, MD sent at 05/03/2018 11:09 AM EDT ----- Lab unremarkable

## 2018-05-03 NOTE — Telephone Encounter (Signed)
Called pt & LVM (ok per DPR) informing pt that labs are unremarkable. Left office number in case pt has any questions but informed her a return call is not required.

## 2018-05-16 ENCOUNTER — Ambulatory Visit
Admission: RE | Admit: 2018-05-16 | Discharge: 2018-05-16 | Disposition: A | Discharge status: Home / Self Care | Payer: Medicare Other | Source: Ambulatory Visit | Attending: Neurology | Admitting: Neurology

## 2018-05-16 DIAGNOSIS — H539 Unspecified visual disturbance: Secondary | ICD-10-CM

## 2018-05-16 DIAGNOSIS — I671 Cerebral aneurysm, nonruptured: Secondary | ICD-10-CM

## 2018-05-16 DIAGNOSIS — R51 Headache with orthostatic component, not elsewhere classified: Secondary | ICD-10-CM

## 2018-05-16 DIAGNOSIS — H93A2 Pulsatile tinnitus, left ear: Secondary | ICD-10-CM

## 2018-05-16 DIAGNOSIS — H9190 Unspecified hearing loss, unspecified ear: Secondary | ICD-10-CM

## 2018-05-16 DIAGNOSIS — R519 Headache, unspecified: Secondary | ICD-10-CM

## 2018-05-16 MED ORDER — GADOBENATE DIMEGLUMINE 529 MG/ML IV SOLN
19.0000 mL | Freq: Once | INTRAVENOUS | Status: AC | PRN
  Administered 2018-05-16: 19 mL via INTRAVENOUS

## 2018-05-21 ENCOUNTER — Telehealth: Payer: Self-pay | Admitting: Neurology

## 2018-05-21 NOTE — Telephone Encounter (Signed)
MRI brain normal. However the MRA of the blood vessels showed some artery irregularity which may be due to the imaging modality, sometimes that happens. We recommend a CTA of the head and neck please let me know if she is willing and I can order thanks thanks

## 2018-05-22 ENCOUNTER — Other Ambulatory Visit: Payer: Self-pay | Admitting: Neurology

## 2018-05-22 DIAGNOSIS — I773 Arterial fibromuscular dysplasia: Secondary | ICD-10-CM

## 2018-05-22 DIAGNOSIS — I679 Cerebrovascular disease, unspecified: Secondary | ICD-10-CM

## 2018-05-22 DIAGNOSIS — I6529 Occlusion and stenosis of unspecified carotid artery: Secondary | ICD-10-CM

## 2018-05-22 NOTE — Telephone Encounter (Signed)
Called pt and discussed results of MRI and MRA. She is aware that MRI brain is normal however there is some artery irregularity on the MRA. Unsure if this is due to imaging modality so Dr. Lucia GaskinsAhern would like to order a CT-A if pt agrees to look at vessels of head and neck. Pt verbalized understanding and agreement to the order for CT-A head and neck.

## 2018-05-23 ENCOUNTER — Telehealth: Payer: Self-pay | Admitting: Neurology

## 2018-05-23 NOTE — Telephone Encounter (Signed)
Medicare/medicaid order sent to [facility] no auth they will reach out to the pt to schedule.  °

## 2018-06-04 ENCOUNTER — Other Ambulatory Visit: Payer: Medicare Other

## 2018-06-04 ENCOUNTER — Ambulatory Visit
Admission: RE | Admit: 2018-06-04 | Discharge: 2018-06-04 | Disposition: A | Discharge status: Home / Self Care | Payer: Medicare Other | Source: Ambulatory Visit | Attending: Neurology | Admitting: Neurology

## 2018-06-04 DIAGNOSIS — I63233 Cerebral infarction due to unspecified occlusion or stenosis of bilateral carotid arteries: Secondary | ICD-10-CM | POA: Diagnosis not present

## 2018-06-04 DIAGNOSIS — R51 Headache: Secondary | ICD-10-CM | POA: Diagnosis not present

## 2018-06-04 DIAGNOSIS — I679 Cerebrovascular disease, unspecified: Secondary | ICD-10-CM

## 2018-06-04 DIAGNOSIS — I6529 Occlusion and stenosis of unspecified carotid artery: Secondary | ICD-10-CM

## 2018-06-04 DIAGNOSIS — I773 Arterial fibromuscular dysplasia: Secondary | ICD-10-CM

## 2018-06-04 MED ORDER — IOPAMIDOL (ISOVUE-370) INJECTION 76%
75.0000 mL | Freq: Once | INTRAVENOUS | Status: AC | PRN
  Administered 2018-06-04: 75 mL via INTRAVENOUS

## 2018-06-07 ENCOUNTER — Telehealth: Payer: Self-pay | Admitting: Neurology

## 2018-06-07 DIAGNOSIS — I776 Arteritis, unspecified: Secondary | ICD-10-CM

## 2018-06-07 NOTE — Telephone Encounter (Signed)
1. Paient's CTA of the neck showed possible enlarged heart, further imaging such as xray was recommended. Patient needs to follow up with pcp to order the cxr and any further evaluation needed, within 4 weeks and go to ED if symptomatic (Dr.  Julio Sickssei-Bonsu cced on this message)  .   2. The CTA of the neck showed irregularities, we need to send her to Dr. Corliss Skainseveshwar for cerebral angiogram to further investigate for vasculitis. Please discuss and if she agrees place referral to IR (Interventional Radiology) for cerebral angiogram for Vasculitis (please inform Annabelle HarmanDana when placed so she can call Dr. Ashok Cordia's office)   IMPRESSION: CTA NECK:  1. Normal CT angiogram of the neck. 2. Mild potential cardiomegaly.  Recommend chest radiograph.  CTA HEAD:  1. Limited by delayed phase which partially obscures arteries. 2. Mild luminal irregularity proximal vessels concerning for vasculopathy, possibly atherosclerosis. 3. DSA catheter neuroangiogram would be definitive.

## 2018-06-08 NOTE — Telephone Encounter (Addendum)
I called and discussed results per Dr. Lucia GaskinsAhern notes below. She verbalized understanding.  She will contact her PCP to get appt to get CXR and be referred to specialist if needed.  She is aware we are going to place referral to IR for CT angiogram for eval of vasculitis. I explained what this procedure was and what we are looking for. She is aware she will be called within a week or so to get appt. She will call back if she doesn't hear anything She will proceed to ED if she has new/worsening sx.  She questioned if her learning disabilities are because of the findings. Advised she should have further work up first to determine potential cause of sx.  I placed referral to IR as requested by Dr. Lucia GaskinsAhern.

## 2018-06-08 NOTE — Addendum Note (Signed)
Addended by: Hillis RangeKING, Hamzah Savoca L on: 06/08/2018 10:44 AM   Modules accepted: Orders

## 2018-06-11 ENCOUNTER — Telehealth (HOSPITAL_COMMUNITY): Payer: Self-pay

## 2018-06-11 NOTE — Telephone Encounter (Signed)
Called to schedule consult, no answer, left vm. AW  

## 2018-06-11 NOTE — Telephone Encounter (Signed)
[facility] to [facility] [phone] - fax [phone]  - order has been sent .

## 2018-06-13 ENCOUNTER — Other Ambulatory Visit (HOSPITAL_COMMUNITY): Payer: Self-pay | Admitting: Interventional Radiology

## 2018-06-13 DIAGNOSIS — I776 Arteritis, unspecified: Secondary | ICD-10-CM

## 2018-06-25 ENCOUNTER — Other Ambulatory Visit (HOSPITAL_COMMUNITY): Payer: Self-pay | Admitting: Interventional Radiology

## 2018-06-25 ENCOUNTER — Ambulatory Visit (HOSPITAL_COMMUNITY)
Admission: RE | Admit: 2018-06-25 | Discharge: 2018-06-25 | Disposition: A | Discharge status: Home / Self Care | Payer: Medicare Other | Source: Ambulatory Visit | Attending: Interventional Radiology | Admitting: Interventional Radiology

## 2018-06-25 DIAGNOSIS — I776 Arteritis, unspecified: Secondary | ICD-10-CM

## 2018-06-25 DIAGNOSIS — I10 Essential (primary) hypertension: Secondary | ICD-10-CM | POA: Diagnosis not present

## 2018-06-25 DIAGNOSIS — E669 Obesity, unspecified: Secondary | ICD-10-CM | POA: Diagnosis not present

## 2018-06-25 DIAGNOSIS — Z720 Tobacco use: Secondary | ICD-10-CM | POA: Diagnosis not present

## 2018-06-25 DIAGNOSIS — I771 Stricture of artery: Secondary | ICD-10-CM

## 2018-06-25 DIAGNOSIS — G43101 Migraine with aura, not intractable, with status migrainosus: Secondary | ICD-10-CM | POA: Diagnosis not present

## 2018-06-25 DIAGNOSIS — M549 Dorsalgia, unspecified: Secondary | ICD-10-CM | POA: Diagnosis not present

## 2018-06-25 DIAGNOSIS — I63512 Cerebral infarction due to unspecified occlusion or stenosis of left middle cerebral artery: Secondary | ICD-10-CM | POA: Diagnosis not present

## 2018-06-25 DIAGNOSIS — E559 Vitamin D deficiency, unspecified: Secondary | ICD-10-CM | POA: Diagnosis not present

## 2018-06-25 DIAGNOSIS — R7303 Prediabetes: Secondary | ICD-10-CM | POA: Diagnosis not present

## 2018-06-25 DIAGNOSIS — J45909 Unspecified asthma, uncomplicated: Secondary | ICD-10-CM | POA: Diagnosis not present

## 2018-06-25 DIAGNOSIS — G629 Polyneuropathy, unspecified: Secondary | ICD-10-CM | POA: Diagnosis not present

## 2018-06-25 NOTE — Consult Note (Signed)
Chief Complaint: Patient was seen in consultation today for left MCA M2 segment stenosis and bilateral vertebral artery stenosis.  Referring Physician(s): Anson Fret  Supervising Physician: Julieanne Cotton  Patient Status: [facility] - Out-pt  History of Present Illness: Debra Washington is a 37 y.o. female with a past medical history of hypertension, migraines, asthma, GERD, pre-diabetes, neuropathy, arthritis, back pain, obesity, and tobacco abuse. She has had migraines for years which have decreased in frequency/severity over the years. However, a few months ago she had a 3 day migraine that prompted her to go to her PCP. She was referred to Dr. Lucia Gaskins (neruology) for management.  MRI/MRA brain/head 05/16/2018: 1. This is a normal MRI of the brain with and without contrast. 2. Luminal irregularity or stenosis is noted in the internal carotid arteries, left greater than right and in the left M2 and distal vertebral arteries.  Although this could be due to artifact, this could also be due to due to intracranial stenosis or fibromuscular dysplasia.  Consider conventional or CT angiogram for further evaluation. 3. No intracranial aneurysms were detected.  CTA head/neck 06/04/2018: 1. Normal CT angiogram of the neck. 2. Mild potential cardiomegaly.  Recommend chest radiograph. 3. Limited by delayed phase which partially obscures arteries. 4. Mild luminal irregularity proximal vessels concerning for vasculopathy, possibly atherosclerosis. 5. DSA catheter neuroangiogram would be definitive.  IR requested by Dr. Lucia Gaskins for management of her left MCA M2 segment stenosis and bilateral vertebral artery stenosis. Patient awake and alert sitting in chair. Complains of migraines. States that they occur behind her bilateral eyes and rates migraines as 10/10. States she takes Rizatriptan, Ibuprofen, and sleeps when she gets migraines. States migraines are associated with nausea, blurred vision, and  balance difficulties. States that she "walks into things". Denies vomiting or syncope associated with migraines. Complains of bilateral intermittent tinnitus, left>right. Describes tinnitus as a "ringing noise". Denies weakness, numbness/tingling, diplopia, hearing changes, or speech difficulty.   Past Medical History:  Diagnosis Date  . Asthma   . Hx of ectopic pregnancy 2006  . Migraine     Past Surgical History:  Procedure Laterality Date  . CARPAL TUNNEL RELEASE Right 01/2018  . SALPINGOOPHORECTOMY  2006   pt not sure if her ovaries were removed     Allergies: Penicillins  Medications: Prior to Admission medications   Medication Sig Start Date End Date Taking? Authorizing Provider  albuterol (PROVENTIL HFA;VENTOLIN HFA) 108 (90 BASE) MCG/ACT inhaler Inhale 2 puffs into the lungs every 4 (four) hours as needed for wheezing or shortness of breath. For shortness of breath    [provider]  ALPRAZolam (XANAX) 0.25 MG tablet Take 1-2 tablets 30-60 minutes before procedure. May repeat if needed. Do not drive. 05/02/18   Anson Fret, MD  cyclobenzaprine (FLEXERIL) 10 MG tablet Take 1 tablet (10 mg total) by mouth 2 (two) times daily as needed for muscle spasms (For back spasms). 03/25/15   Lurene Shadow, PA-C  ibuprofen (ADVIL,MOTRIN) 800 MG tablet Take 1 tablet (800 mg total) by mouth 3 (three) times daily. Patient taking differently: Take 800 mg by mouth 3 (three) times daily as needed.  07/11/17   Elson Areas, PA-C  predniSONE (DELTASONE) 20 MG tablet Take 20 mg by mouth daily. For one week 04/18/18   [provider]  rizatriptan (MAXALT-MLT) 10 MG disintegrating tablet Take 1 tablet (10 mg total) by mouth as needed for migraine. May repeat in 2 hours if needed 05/02/18  Anson FretAhern, Antonia B, MD     Family History  Problem Relation Age of Onset  . Migraines Mother   . Aneurysm Father   . Congestive Heart Failure Maternal Grandmother   . Hypertension  Maternal Grandmother     Social History   Socioeconomic History  . Marital status: Single    Spouse name: Not on file  . Number of children: 3  . Years of education: Not on file  . Highest education level: Not on file  Occupational History  . Not on file  Social Needs  . Financial resource strain: Not on file  . Food insecurity:    Worry: Not on file    Inability: Not on file  . Transportation needs:    Medical: Not on file    Non-medical: Not on file  Tobacco Use  . Smoking status: Current Every Day Smoker    Types: Cigars  . Smokeless tobacco: Never Used  Substance and Sexual Activity  . Alcohol use: No  . Drug use: Not Currently    Comment: smoked weed 2 years ago  . Sexual activity: Yes    Partners: Male  Lifestyle  . Physical activity:    Days per week: Not on file    Minutes per session: Not on file  . Stress: Not on file  Relationships  . Social connections:    Talks on phone: Not on file    Gets together: Not on file    Attends religious service: Not on file    Active member of club or organization: Not on file    Attends meetings of clubs or organizations: Not on file    Relationship status: Not on file  Other Topics Concern  . Not on file  Social History Narrative   3 children, one in her care.      Lives at home with her son   Right handed     Review of Systems: A 12 point ROS discussed and pertinent positives are indicated in the HPI above.  All other systems are negative.  Review of Systems  Constitutional: Negative for chills and fever.  HENT: Positive for tinnitus. Negative for hearing loss.   Eyes: Positive for visual disturbance.  Respiratory: Negative for shortness of breath and wheezing.   Cardiovascular: Negative for chest pain and palpitations.  Neurological: Positive for dizziness and headaches. Negative for syncope, speech difficulty, weakness and numbness.  Psychiatric/Behavioral: Negative for behavioral problems and confusion.     Vital Signs: There were no vitals taken for this visit.  Physical Exam  Constitutional: She is oriented to person, place, and time. She appears well-developed and well-nourished. No distress.  Pulmonary/Chest: Effort normal. No respiratory distress.  Neurological: She is alert and oriented to person, place, and time.  Skin: Skin is warm and dry.  Psychiatric: She has a normal mood and affect. Her behavior is normal. Judgment and thought content normal.     Imaging: Ct Angio Head W Or Wo Contrast  Result Date: 06/05/2018 CLINICAL DATA:  Follow-up carotid artery stenosis. History of headaches, pulsatile tinnitus. Family history of aneurysm. EXAM: CT ANGIOGRAPHY HEAD AND NECK TECHNIQUE: Multidetector CT imaging of the head and neck was performed using the standard protocol during bolus administration of intravenous contrast. Multiplanar CT image reconstructions and MIPs were obtained to evaluate the vascular anatomy. Carotid stenosis measurements (when applicable) are obtained utilizing NASCET criteria, using the distal internal carotid diameter as the denominator. CONTRAST:  75mL ISOVUE-370 IOPAMIDOL (ISOVUE-370) INJECTION 76% COMPARISON:  MRI/MRA head May 16, 2018. FINDINGS: CT HEAD FINDINGS BRAIN: No intraparenchymal hemorrhage, mass effect nor midline shift. The ventricles and sulci are normal. No acute large vascular territory infarcts. No abnormal extra-axial fluid collections. Basal cisterns are patent. VASCULAR: Unremarkable. SKULL/SOFT TISSUES: No skull fracture. No significant soft tissue swelling. ORBITS/SINUSES: The included ocular globes and orbital contents are normal.The mastoid aircells and included paranasal sinuses are well-aerated. OTHER: None. CTA NECK FINDINGS: AORTIC ARCH: Normal appearance of the thoracic arch, normal branch pattern. The origins of the innominate, left Common carotid artery and subclavian artery are widely patent. RIGHT CAROTID SYSTEM: Common carotid artery  is patent. Normal appearance of the carotid bifurcation without hemodynamically significant stenosis by NASCET criteria. Normal appearance of the internal carotid artery. LEFT CAROTID SYSTEM: Common carotid artery is patent. Normal appearance of the carotid bifurcation without hemodynamically significant stenosis by NASCET criteria. Normal appearance of the internal carotid artery. VERTEBRAL ARTERIES:Limited assessment of RIGHT vertebral artery due to venous contamination. LEFT vertebral artery is dominant. Patent bilateral vertebral arteries. SKELETON: No acute osseous process though bone windows have not been submitted. Scattered dental caries. OTHER NECK: Soft tissues of the neck are nonacute though, not tailored for evaluation. UPPER CHEST: Included lung apices are clear. No superior mediastinal lymphadenopathy. Occluded heart appears mildly enlarged. CTA HEAD FINDINGS (delayed phase with venous enhancement): ANTERIOR CIRCULATION: Patent cervical internal carotid arteries, petrous, cavernous and supra clinoid internal carotid arteries. Patent anterior communicating artery. Patent anterior and middle cerebral arteries. Mild luminal irregularity proximal LEFT middle cerebral artery and suspected luminal irregularity anterior cerebral arteries. No large vessel occlusion, significant stenosis, contrast extravasation or aneurysm. POSTERIOR CIRCULATION: Patent vertebral arteries, vertebrobasilar junction and basilar artery, as well as main branch vessels. Patent posterior cerebral arteries. Mild luminal irregularity proximal RIGHT posterior cerebral artery. No large vessel occlusion, significant stenosis, contrast extravasation or aneurysm. VENOUS SINUSES: Major dural venous sinuses are patent though not tailored for evaluation on this angiographic examination. ANATOMIC VARIANTS: None. DELAYED PHASE: No abnormal intracranial enhancement. MIP images reviewed. IMPRESSION: CTA NECK: 1. Normal CT angiogram of the neck. 2.  Mild potential cardiomegaly.  Recommend chest radiograph. CTA HEAD: 1. Limited by delayed phase which partially obscures arteries. 2. Mild luminal irregularity proximal vessels concerning for vasculopathy, possibly atherosclerosis. 3. DSA catheter neuroangiogram would be definitive. Electronically Signed   By: Awilda Metro M.D.   On: 06/05/2018 01:19   Ct Angio Neck W Or Wo Contrast  Result Date: 06/05/2018 CLINICAL DATA:  Follow-up carotid artery stenosis. History of headaches, pulsatile tinnitus. Family history of aneurysm. EXAM: CT ANGIOGRAPHY HEAD AND NECK TECHNIQUE: Multidetector CT imaging of the head and neck was performed using the standard protocol during bolus administration of intravenous contrast. Multiplanar CT image reconstructions and MIPs were obtained to evaluate the vascular anatomy. Carotid stenosis measurements (when applicable) are obtained utilizing NASCET criteria, using the distal internal carotid diameter as the denominator. CONTRAST:  75mL ISOVUE-370 IOPAMIDOL (ISOVUE-370) INJECTION 76% COMPARISON:  MRI/MRA head May 16, 2018. FINDINGS: CT HEAD FINDINGS BRAIN: No intraparenchymal hemorrhage, mass effect nor midline shift. The ventricles and sulci are normal. No acute large vascular territory infarcts. No abnormal extra-axial fluid collections. Basal cisterns are patent. VASCULAR: Unremarkable. SKULL/SOFT TISSUES: No skull fracture. No significant soft tissue swelling. ORBITS/SINUSES: The included ocular globes and orbital contents are normal.The mastoid aircells and included paranasal sinuses are well-aerated. OTHER: None. CTA NECK FINDINGS: AORTIC ARCH: Normal appearance of the thoracic arch, normal branch pattern. The origins of the innominate, left Common carotid  artery and subclavian artery are widely patent. RIGHT CAROTID SYSTEM: Common carotid artery is patent. Normal appearance of the carotid bifurcation without hemodynamically significant stenosis by NASCET criteria. Normal  appearance of the internal carotid artery. LEFT CAROTID SYSTEM: Common carotid artery is patent. Normal appearance of the carotid bifurcation without hemodynamically significant stenosis by NASCET criteria. Normal appearance of the internal carotid artery. VERTEBRAL ARTERIES:Limited assessment of RIGHT vertebral artery due to venous contamination. LEFT vertebral artery is dominant. Patent bilateral vertebral arteries. SKELETON: No acute osseous process though bone windows have not been submitted. Scattered dental caries. OTHER NECK: Soft tissues of the neck are nonacute though, not tailored for evaluation. UPPER CHEST: Included lung apices are clear. No superior mediastinal lymphadenopathy. Occluded heart appears mildly enlarged. CTA HEAD FINDINGS (delayed phase with venous enhancement): ANTERIOR CIRCULATION: Patent cervical internal carotid arteries, petrous, cavernous and supra clinoid internal carotid arteries. Patent anterior communicating artery. Patent anterior and middle cerebral arteries. Mild luminal irregularity proximal LEFT middle cerebral artery and suspected luminal irregularity anterior cerebral arteries. No large vessel occlusion, significant stenosis, contrast extravasation or aneurysm. POSTERIOR CIRCULATION: Patent vertebral arteries, vertebrobasilar junction and basilar artery, as well as main branch vessels. Patent posterior cerebral arteries. Mild luminal irregularity proximal RIGHT posterior cerebral artery. No large vessel occlusion, significant stenosis, contrast extravasation or aneurysm. VENOUS SINUSES: Major dural venous sinuses are patent though not tailored for evaluation on this angiographic examination. ANATOMIC VARIANTS: None. DELAYED PHASE: No abnormal intracranial enhancement. MIP images reviewed. IMPRESSION: CTA NECK: 1. Normal CT angiogram of the neck. 2. Mild potential cardiomegaly.  Recommend chest radiograph. CTA HEAD: 1. Limited by delayed phase which partially obscures  arteries. 2. Mild luminal irregularity proximal vessels concerning for vasculopathy, possibly atherosclerosis. 3. DSA catheter neuroangiogram would be definitive. Electronically Signed   By: Awilda Metro M.D.   On: 06/05/2018 01:19    Labs:  CBC: No results for input(s): WBC, HGB, HCT, PLT in the last 8760 hours.  COAGS: No results for input(s): INR, APTT in the last 8760 hours.  BMP: Recent Labs    05/02/18 1508  NA 141  K 3.9  CL 108*  CO2 23  GLUCOSE 73  BUN 15  CALCIUM 9.6  CREATININE 0.92  GFRNONAA 79  GFRAA 91    LIVER FUNCTION TESTS: No results for input(s): BILITOT, AST, ALT, ALKPHOS, PROT, ALBUMIN in the last 8760 hours.  TUMOR MARKERS: No results for input(s): AFPTM, CEA, CA199, CHROMGRNA in the last 8760 hours.  Assessment and Plan:  Left MCA M2 segment stenosis. Bilateral vertebral artery stenosis. Reviewed imaging with patient. Brought to her attention was multiple areas of intracranial stenosis. Explained that the best course of management for patient's multiple areas of intracranial stenosis is with a procedure called an image-guided diagnostic cerebral angiogram. Explained procedure, including risks and benefits. Explained that this procedure will help [place] to better quantify her stenosis, which will aid in treatment options.  Plan for follow-up with an image-guided diagnostic cerebral angiogram ASAP. Our scheduler, Morrie Sheldon, discussed dates with patient and scheduled procedure for 07/03/2018. Instructed patient to stay hydrated by drinking plenty of water.  All questions answered and concerns addressed. Patient conveys understanding and agrees with plan.   Thank you for this interesting consult.  I greatly enjoyed meeting Debra Washington and look forward to participating in their care.  A copy of this report was sent to the requesting provider on this date.  Electronically Signed: Elwin Mocha, PA-C 06/25/2018, 11:09 AM   I spent a total  of 30  Minutes in face to face in clinical consultation, greater than 50% of which was counseling/coordinating care for left MCA M2 segment stenosis AND bilateral vertebral artery stenosis.

## 2018-07-03 ENCOUNTER — Ambulatory Visit (HOSPITAL_COMMUNITY): Payer: Medicare Other

## 2018-07-04 ENCOUNTER — Other Ambulatory Visit: Payer: Self-pay | Admitting: Radiology

## 2018-07-05 ENCOUNTER — Other Ambulatory Visit (HOSPITAL_COMMUNITY): Payer: Self-pay | Admitting: Interventional Radiology

## 2018-07-05 ENCOUNTER — Ambulatory Visit (HOSPITAL_COMMUNITY)
Admission: RE | Admit: 2018-07-05 | Discharge: 2018-07-05 | Disposition: A | Discharge status: Home / Self Care | Payer: Medicare Other | Source: Ambulatory Visit | Attending: Interventional Radiology | Admitting: Interventional Radiology

## 2018-07-05 ENCOUNTER — Encounter (HOSPITAL_COMMUNITY): Payer: Self-pay

## 2018-07-05 DIAGNOSIS — F17290 Nicotine dependence, other tobacco product, uncomplicated: Secondary | ICD-10-CM | POA: Insufficient documentation

## 2018-07-05 DIAGNOSIS — J45909 Unspecified asthma, uncomplicated: Secondary | ICD-10-CM | POA: Insufficient documentation

## 2018-07-05 DIAGNOSIS — G629 Polyneuropathy, unspecified: Secondary | ICD-10-CM | POA: Diagnosis not present

## 2018-07-05 DIAGNOSIS — H93A2 Pulsatile tinnitus, left ear: Secondary | ICD-10-CM | POA: Insufficient documentation

## 2018-07-05 DIAGNOSIS — R7303 Prediabetes: Secondary | ICD-10-CM | POA: Diagnosis not present

## 2018-07-05 DIAGNOSIS — Z6835 Body mass index (BMI) 35.0-35.9, adult: Secondary | ICD-10-CM | POA: Diagnosis not present

## 2018-07-05 DIAGNOSIS — Z880 Allergy status to penicillin: Secondary | ICD-10-CM | POA: Diagnosis not present

## 2018-07-05 DIAGNOSIS — G43909 Migraine, unspecified, not intractable, without status migrainosus: Secondary | ICD-10-CM | POA: Insufficient documentation

## 2018-07-05 DIAGNOSIS — I6503 Occlusion and stenosis of bilateral vertebral arteries: Secondary | ICD-10-CM | POA: Diagnosis not present

## 2018-07-05 DIAGNOSIS — K219 Gastro-esophageal reflux disease without esophagitis: Secondary | ICD-10-CM | POA: Diagnosis not present

## 2018-07-05 DIAGNOSIS — I10 Essential (primary) hypertension: Secondary | ICD-10-CM | POA: Diagnosis not present

## 2018-07-05 DIAGNOSIS — I771 Stricture of artery: Secondary | ICD-10-CM

## 2018-07-05 DIAGNOSIS — M549 Dorsalgia, unspecified: Secondary | ICD-10-CM | POA: Diagnosis not present

## 2018-07-05 DIAGNOSIS — E669 Obesity, unspecified: Secondary | ICD-10-CM | POA: Diagnosis not present

## 2018-07-05 DIAGNOSIS — M1990 Unspecified osteoarthritis, unspecified site: Secondary | ICD-10-CM | POA: Diagnosis not present

## 2018-07-05 DIAGNOSIS — I6602 Occlusion and stenosis of left middle cerebral artery: Secondary | ICD-10-CM | POA: Diagnosis not present

## 2018-07-05 HISTORY — PX: IR ANGIO INTRA EXTRACRAN SEL COM CAROTID INNOMINATE BILAT MOD SED: IMG5360

## 2018-07-05 HISTORY — PX: IR ANGIO VERTEBRAL SEL VERTEBRAL BILAT MOD SED: IMG5369

## 2018-07-05 LAB — BASIC METABOLIC PANEL
ANION GAP: 7 (ref 5–15)
BUN: 10 mg/dL (ref 6–20)
CALCIUM: 8.9 mg/dL (ref 8.9–10.3)
CO2: 24 mmol/L (ref 22–32)
CREATININE: 0.85 mg/dL (ref 0.44–1.00)
Chloride: 109 mmol/L (ref 98–111)
GFR calc non Af Amer: >60 mL/min (ref >60)
GLUCOSE: 92 mg/dL (ref 70–99)
Potassium: 3.9 mmol/L (ref 3.5–5.1)
Sodium: 140 mmol/L (ref 135–145)

## 2018-07-05 LAB — CBC
HCT: 41.3 % (ref 36.0–46.0)
HEMOGLOBIN: 13.2 g/dL (ref 12.0–15.0)
MCH: 27.8 pg (ref 26.0–34.0)
MCHC: 32 g/dL (ref 30.0–36.0)
MCV: 87.1 fL (ref 78.0–100.0)
Platelets: 221 10*3/uL (ref 150–400)
RBC: 4.74 MIL/uL (ref 3.87–5.11)
RDW: 12.5 % (ref 11.5–15.5)
WBC: 5.6 10*3/uL (ref 4.0–10.5)

## 2018-07-05 LAB — PROTIME-INR
INR: 1.03
Prothrombin Time: 13.4 seconds (ref 11.4–15.2)

## 2018-07-05 MED ORDER — HEPARIN SODIUM (PORCINE) 1000 UNIT/ML IJ SOLN
INTRAMUSCULAR | Status: DC | PRN
  Administered 2018-07-05: 1000 [IU] via INTRAVENOUS

## 2018-07-05 MED ORDER — MIDAZOLAM HCL 2 MG/2ML IJ SOLN
INTRAMUSCULAR | Status: AC
  Filled 2018-07-05: qty 2

## 2018-07-05 MED ORDER — LIDOCAINE HCL 1 % IJ SOLN
INTRAMUSCULAR | Status: AC
  Filled 2018-07-05: qty 20

## 2018-07-05 MED ORDER — HYDRALAZINE HCL 20 MG/ML IJ SOLN: 5.0000 mg | INTRAMUSCULAR | Status: DC | PRN

## 2018-07-05 MED ORDER — SODIUM CHLORIDE 0.9 % IV SOLN
INTRAVENOUS | Status: DC | PRN
  Administered 2018-07-05: 10 mL/h via INTRAVENOUS

## 2018-07-05 MED ORDER — SODIUM CHLORIDE 0.9 % IV SOLN: INTRAVENOUS | Status: AC

## 2018-07-05 MED ORDER — HEPARIN SODIUM (PORCINE) 1000 UNIT/ML IJ SOLN
INTRAMUSCULAR | Status: AC
  Filled 2018-07-05: qty 1

## 2018-07-05 MED ORDER — SODIUM CHLORIDE 0.9 % IV SOLN: Freq: Once | INTRAVENOUS | Status: DC

## 2018-07-05 MED ORDER — FENTANYL CITRATE (PF) 100 MCG/2ML IJ SOLN
INTRAMUSCULAR | Status: AC
  Filled 2018-07-05: qty 2

## 2018-07-05 MED ORDER — IOPAMIDOL (ISOVUE-300) INJECTION 61%
INTRAVENOUS | Status: AC
  Filled 2018-07-05: qty 50

## 2018-07-05 MED ORDER — FENTANYL CITRATE (PF) 100 MCG/2ML IJ SOLN
INTRAMUSCULAR | Status: DC | PRN
  Administered 2018-07-05 (×2): 25 ug via INTRAVENOUS

## 2018-07-05 MED ORDER — IOHEXOL 300 MG/ML  SOLN
150.0000 mL | Freq: Once | INTRAMUSCULAR | Status: AC | PRN
  Administered 2018-07-05: 85 mL via INTRA_ARTERIAL

## 2018-07-05 MED ORDER — MIDAZOLAM HCL 2 MG/2ML IJ SOLN
INTRAMUSCULAR | Status: DC | PRN
  Administered 2018-07-05: 0.5 mg via INTRAVENOUS
  Administered 2018-07-05: 1 mg via INTRAVENOUS

## 2018-07-05 MED ORDER — LIDOCAINE HCL 1 % IJ SOLN
INTRAMUSCULAR | Status: DC | PRN
  Administered 2018-07-05: 22 mL

## 2018-07-05 NOTE — Procedures (Signed)
S/P 4 vessel cerebral arteriogram RT CFA approach. Findings . 1.Mild bilateral supraclinoid ICA stenosis . 2.No obvious aneurysms seen.Study partially sub optimal   due to motion artifact.

## 2018-07-05 NOTE — Sedation Documentation (Signed)
Attempted to call report. Will call back per Short Stay unit sec.

## 2018-07-05 NOTE — H&P (Signed)
Chief Complaint: Left MCA M2 segment stenosis and bilateral vertebral artery stenosis.  Referring Physician(s): Anson Fret  Supervising Physician: Julieanne Cotton  Patient Status: [facility] - Out-pt  History of Present Illness: Debra Washington is a 37 y.o. female with a past medical history of hypertension, migraines, asthma, GERD, pre-diabetes, neuropathy, arthritis, back pain, obesity, and tobacco abuse.   She has had migraines for years which have decreased in frequency/severity over the years.  However, a few months ago she had a 3 day migraine that prompted her to go to her PCP.   She was referred to Dr. Lucia Gaskins (neurology) for management.  MRI/MRA brain/head 05/16/2018: 1. This is a normal MRI of the brain with and without contrast. 2.Luminal irregularity or stenosis is noted in the internal carotid arteries, left greater than right and in the left M2 and distal vertebral arteries. Although this could be due to artifact, this could also be due to due to intracranial stenosis or fibromuscular dysplasia. Consider conventional or CT angiogram for further evaluation. 3.No intracranial aneurysms were detected.  CTA head/neck 06/04/2018: 1. Normal CT angiogram of the neck. 2. Mild potential cardiomegaly. Recommend chest radiograph. 3. Limited by delayed phase which partially obscures arteries. 4. Mild luminal irregularity proximal vessels concerning for vasculopathy, possibly atherosclerosis. 5. DSA catheter neuroangiogram would be definitive.  She saw Dr. Corliss Skains on 06/25/2018 for evaluation.  She is here today for cerebral angiography.  She is NPO. She does not take blood thinners. She feels well today. She does not have a migraine today be she did have one last night.    She denies N/V, Fever/chills. ROS otherwise negative.   Past Medical History:  Diagnosis Date  . Asthma   . Hx of ectopic pregnancy 2006  . Migraine     Past Surgical History:    Procedure Laterality Date  . CARPAL TUNNEL RELEASE Right 01/2018  . SALPINGOOPHORECTOMY  2006   pt not sure if her ovaries were removed     Allergies: Penicillins  Medications: Prior to Admission medications   Medication Sig Start Date End Date Taking? Authorizing Provider  albuterol (PROVENTIL HFA;VENTOLIN HFA) 108 (90 BASE) MCG/ACT inhaler Inhale 2 puffs into the lungs every 4 (four) hours as needed for wheezing or shortness of breath. For shortness of breath   Yes [provider]  ibuprofen (ADVIL,MOTRIN) 800 MG tablet Take 1 tablet (800 mg total) by mouth 3 (three) times daily. Patient taking differently: Take 800 mg by mouth 3 (three) times daily as needed.  07/11/17  Yes Cheron Schaumann K, PA-C  rizatriptan (MAXALT-MLT) 10 MG disintegrating tablet Take 1 tablet (10 mg total) by mouth as needed for migraine. May repeat in 2 hours if needed 05/02/18  Yes Anson Fret, MD  ALPRAZolam Prudy Feeler) 0.25 MG tablet Take 1-2 tablets 30-60 minutes before procedure. May repeat if needed. Do not drive. 05/02/18   Anson Fret, MD  cyclobenzaprine (FLEXERIL) 10 MG tablet Take 1 tablet (10 mg total) by mouth 2 (two) times daily as needed for muscle spasms (For back spasms). 03/25/15   Lurene Shadow, PA-C  predniSONE (DELTASONE) 20 MG tablet Take 20 mg by mouth daily. For one week 04/18/18   [provider]     Family History  Problem Relation Age of Onset  . Migraines Mother   . Aneurysm Father   . Congestive Heart Failure Maternal Grandmother   . Hypertension Maternal Grandmother     Social History   Socioeconomic  History  . Marital status: Single    Spouse name: Not on file  . Number of children: 3  . Years of education: Not on file  . Highest education level: Not on file  Occupational History  . Not on file  Social Needs  . Financial resource strain: Not on file  . Food insecurity:    Worry: Not on file    Inability: Not on file  . Transportation needs:     Medical: Not on file    Non-medical: Not on file  Tobacco Use  . Smoking status: Current Every Day Smoker    Types: Cigars  . Smokeless tobacco: Never Used  Substance and Sexual Activity  . Alcohol use: No  . Drug use: Not Currently    Comment: smoked weed 2 years ago  . Sexual activity: Yes    Partners: Male  Lifestyle  . Physical activity:    Days per week: Patient refused    Minutes per session: Patient refused  . Stress: Not on file  Relationships  . Social connections:    Talks on phone: Patient refused    Gets together: Patient refused    Attends religious service: Patient refused    Active member of club or organization: Patient refused    Attends meetings of clubs or organizations: Patient refused    Relationship status: Patient refused  Other Topics Concern  . Not on file  Social History Narrative   3 children, one in her care.      Lives at home with her son   Right handed     Review of Systems: A 12 point ROS discussed and pertinent positives are indicated in the HPI above.  All other systems are negative. Review of Systems  Vital Signs: BP (!) 156/95 (BP Location: Left Arm)   Pulse 66   Temp 98.7 F (37.1 C) (Oral)   Resp 16   Ht 5\' 1"  (1.549 m)   Wt 86.2 kg   SpO2 100%   BMI 35.90 kg/m   Physical Exam  Constitutional: She is oriented to person, place, and time. She appears well-developed.  HENT:  Head: Normocephalic and atraumatic.  Eyes: EOM are normal.  Neck: Normal range of motion.  Cardiovascular: Normal rate, regular rhythm, normal heart sounds and intact distal pulses.  Pulmonary/Chest: Effort normal and breath sounds normal. No respiratory distress.  Abdominal: She exhibits no distension. There is no tenderness.  Musculoskeletal: Normal range of motion.  Neurological: She is alert and oriented to person, place, and time. No cranial nerve deficit.  Skin: Skin is warm and dry.  Psychiatric: She has a normal mood and affect. Her behavior  is normal. Judgment and thought content normal.  Vitals reviewed.   Imaging: No results found.  Labs:  CBC: Recent Labs    07/05/18 0653  WBC 5.6  HGB 13.2  HCT 41.3  PLT 221    COAGS: Recent Labs    07/05/18 0653  INR 1.03    BMP: Recent Labs    05/02/18 1508 07/05/18 0653  NA 141 140  K 3.9 3.9  CL 108* 109  CO2 23 24  GLUCOSE 73 92  BUN 15 10  CALCIUM 9.6 8.9  CREATININE 0.92 0.85  GFRNONAA 79 >60  GFRAA 91 >60    LIVER FUNCTION TESTS: No results for input(s): BILITOT, AST, ALT, ALKPHOS, PROT, ALBUMIN in the last 8760 hours.  TUMOR MARKERS: No results for input(s): AFPTM, CEA, CA199, CHROMGRNA in the last 8760  hours.  Assessment and Plan: Left MCA M2 segment stenosis and bilateral vertebral artery stenosis.  Will proceed with common femoral artery catheterization and cerebral arteriogram today by Dr. Corliss Skains.  Risks and benefits of cerebral angiogram with intervention were discussed with the patient including, but not limited to bleeding, infection, vascular injury, contrast induced renal failure, stroke or even death.  This interventional procedure involves the use of X-rays and because of the nature of the planned procedure, it is possible that we will have prolonged use of X-ray fluoroscopy.  Potential radiation risks to you include (but are not limited to) the following: - A slightly elevated risk for cancer  several years later in life. This risk is typically less than 0.5% percent. This risk is low in comparison to the normal incidence of human cancer, which is 33% for women and 50% for men according to the American Cancer Society. - Radiation induced injury can include skin redness, resembling a rash, tissue breakdown / ulcers and hair loss (which can be temporary or permanent).   The likelihood of either of these occurring depends on the difficulty of the procedure and whether you are sensitive to radiation due to previous procedures,  disease, or genetic conditions.   IF your procedure requires a prolonged use of radiation, you will be notified and given written instructions for further action.  It is your responsibility to monitor the irradiated area for the 2 weeks following the procedure and to notify your physician if you are concerned that you have suffered a radiation induced injury.    All of the patient's questions were answered, patient is agreeable to proceed.  Consent signed and in chart.  Thank you for this interesting consult.  I greatly enjoyed meeting Dmya Ammons and look forward to participating in their care.  A copy of this report was sent to the requesting provider on this date.  Electronically Signed: Gwynneth Macleod, PA-C   07/05/2018, 8:04 AM      I spent a total of  25 Minutes in face to face in clinical consultation, greater than 50% of which was counseling/coordinating care for cerebral angiography.

## 2018-07-05 NOTE — Discharge Instructions (Signed)

## 2018-07-11 ENCOUNTER — Encounter (HOSPITAL_COMMUNITY): Payer: Self-pay | Admitting: Interventional Radiology

## 2018-07-18 DIAGNOSIS — E559 Vitamin D deficiency, unspecified: Secondary | ICD-10-CM | POA: Diagnosis not present

## 2018-07-18 DIAGNOSIS — R05 Cough: Secondary | ICD-10-CM | POA: Diagnosis not present

## 2018-07-18 DIAGNOSIS — G43909 Migraine, unspecified, not intractable, without status migrainosus: Secondary | ICD-10-CM | POA: Diagnosis not present

## 2018-07-18 DIAGNOSIS — G43101 Migraine with aura, not intractable, with status migrainosus: Secondary | ICD-10-CM | POA: Diagnosis not present

## 2018-07-18 DIAGNOSIS — J028 Acute pharyngitis due to other specified organisms: Secondary | ICD-10-CM | POA: Diagnosis not present

## 2018-07-18 DIAGNOSIS — J45909 Unspecified asthma, uncomplicated: Secondary | ICD-10-CM | POA: Diagnosis not present

## 2018-07-18 DIAGNOSIS — G629 Polyneuropathy, unspecified: Secondary | ICD-10-CM | POA: Diagnosis not present

## 2018-07-18 DIAGNOSIS — E669 Obesity, unspecified: Secondary | ICD-10-CM | POA: Diagnosis not present

## 2018-07-18 DIAGNOSIS — M549 Dorsalgia, unspecified: Secondary | ICD-10-CM | POA: Diagnosis not present

## 2018-07-18 DIAGNOSIS — R7303 Prediabetes: Secondary | ICD-10-CM | POA: Diagnosis not present

## 2018-07-18 DIAGNOSIS — Z720 Tobacco use: Secondary | ICD-10-CM | POA: Diagnosis not present

## 2018-07-18 DIAGNOSIS — I10 Essential (primary) hypertension: Secondary | ICD-10-CM | POA: Diagnosis not present

## 2018-08-01 DIAGNOSIS — M549 Dorsalgia, unspecified: Secondary | ICD-10-CM | POA: Diagnosis not present

## 2018-08-01 DIAGNOSIS — G43101 Migraine with aura, not intractable, with status migrainosus: Secondary | ICD-10-CM | POA: Diagnosis not present

## 2018-08-01 DIAGNOSIS — E669 Obesity, unspecified: Secondary | ICD-10-CM | POA: Diagnosis not present

## 2018-08-01 DIAGNOSIS — Z720 Tobacco use: Secondary | ICD-10-CM | POA: Diagnosis not present

## 2018-08-01 DIAGNOSIS — R7303 Prediabetes: Secondary | ICD-10-CM | POA: Diagnosis not present

## 2018-08-01 DIAGNOSIS — J45909 Unspecified asthma, uncomplicated: Secondary | ICD-10-CM | POA: Diagnosis not present

## 2018-08-01 DIAGNOSIS — R05 Cough: Secondary | ICD-10-CM | POA: Diagnosis not present

## 2018-08-01 DIAGNOSIS — I10 Essential (primary) hypertension: Secondary | ICD-10-CM | POA: Diagnosis not present

## 2018-08-01 DIAGNOSIS — G629 Polyneuropathy, unspecified: Secondary | ICD-10-CM | POA: Diagnosis not present

## 2018-08-01 DIAGNOSIS — E559 Vitamin D deficiency, unspecified: Secondary | ICD-10-CM | POA: Diagnosis not present

## 2018-09-24 DIAGNOSIS — R7303 Prediabetes: Secondary | ICD-10-CM | POA: Diagnosis not present

## 2018-09-24 DIAGNOSIS — E669 Obesity, unspecified: Secondary | ICD-10-CM | POA: Diagnosis not present

## 2018-09-24 DIAGNOSIS — Z720 Tobacco use: Secondary | ICD-10-CM | POA: Diagnosis not present

## 2018-09-24 DIAGNOSIS — M549 Dorsalgia, unspecified: Secondary | ICD-10-CM | POA: Diagnosis not present

## 2018-09-24 DIAGNOSIS — J45909 Unspecified asthma, uncomplicated: Secondary | ICD-10-CM | POA: Diagnosis not present

## 2018-09-24 DIAGNOSIS — R3915 Urgency of urination: Secondary | ICD-10-CM | POA: Diagnosis not present

## 2018-09-24 DIAGNOSIS — I10 Essential (primary) hypertension: Secondary | ICD-10-CM | POA: Diagnosis not present

## 2018-09-24 DIAGNOSIS — G629 Polyneuropathy, unspecified: Secondary | ICD-10-CM | POA: Diagnosis not present

## 2018-09-24 DIAGNOSIS — E559 Vitamin D deficiency, unspecified: Secondary | ICD-10-CM | POA: Diagnosis not present

## 2018-09-24 DIAGNOSIS — G43101 Migraine with aura, not intractable, with status migrainosus: Secondary | ICD-10-CM | POA: Diagnosis not present

## 2018-10-01 DIAGNOSIS — E559 Vitamin D deficiency, unspecified: Secondary | ICD-10-CM | POA: Diagnosis not present

## 2018-10-01 DIAGNOSIS — G43101 Migraine with aura, not intractable, with status migrainosus: Secondary | ICD-10-CM | POA: Diagnosis not present

## 2018-10-01 DIAGNOSIS — I10 Essential (primary) hypertension: Secondary | ICD-10-CM | POA: Diagnosis not present

## 2018-10-01 DIAGNOSIS — M549 Dorsalgia, unspecified: Secondary | ICD-10-CM | POA: Diagnosis not present

## 2018-10-01 DIAGNOSIS — R3915 Urgency of urination: Secondary | ICD-10-CM | POA: Diagnosis not present

## 2018-10-01 DIAGNOSIS — Z720 Tobacco use: Secondary | ICD-10-CM | POA: Diagnosis not present

## 2018-10-01 DIAGNOSIS — E669 Obesity, unspecified: Secondary | ICD-10-CM | POA: Diagnosis not present

## 2018-10-01 DIAGNOSIS — J40 Bronchitis, not specified as acute or chronic: Secondary | ICD-10-CM | POA: Diagnosis not present

## 2018-10-01 DIAGNOSIS — J45909 Unspecified asthma, uncomplicated: Secondary | ICD-10-CM | POA: Diagnosis not present

## 2018-10-01 DIAGNOSIS — G629 Polyneuropathy, unspecified: Secondary | ICD-10-CM | POA: Diagnosis not present

## 2018-10-01 DIAGNOSIS — R7303 Prediabetes: Secondary | ICD-10-CM | POA: Diagnosis not present

## 2018-11-27 ENCOUNTER — Ambulatory Visit: Payer: Medicare Other | Admitting: Adult Health

## 2018-11-28 ENCOUNTER — Encounter: Payer: Self-pay | Admitting: Adult Health

## 2018-12-21 DIAGNOSIS — Z0001 Encounter for general adult medical examination with abnormal findings: Secondary | ICD-10-CM | POA: Diagnosis not present

## 2018-12-21 DIAGNOSIS — J45909 Unspecified asthma, uncomplicated: Secondary | ICD-10-CM | POA: Diagnosis not present

## 2018-12-21 DIAGNOSIS — I10 Essential (primary) hypertension: Secondary | ICD-10-CM | POA: Diagnosis not present

## 2018-12-21 DIAGNOSIS — J40 Bronchitis, not specified as acute or chronic: Secondary | ICD-10-CM | POA: Diagnosis not present

## 2018-12-21 DIAGNOSIS — E669 Obesity, unspecified: Secondary | ICD-10-CM | POA: Diagnosis not present

## 2018-12-21 DIAGNOSIS — G629 Polyneuropathy, unspecified: Secondary | ICD-10-CM | POA: Diagnosis not present

## 2018-12-21 DIAGNOSIS — Z720 Tobacco use: Secondary | ICD-10-CM | POA: Diagnosis not present

## 2018-12-21 DIAGNOSIS — M549 Dorsalgia, unspecified: Secondary | ICD-10-CM | POA: Diagnosis not present

## 2018-12-21 DIAGNOSIS — G43101 Migraine with aura, not intractable, with status migrainosus: Secondary | ICD-10-CM | POA: Diagnosis not present

## 2018-12-21 DIAGNOSIS — R3915 Urgency of urination: Secondary | ICD-10-CM | POA: Diagnosis not present

## 2018-12-21 DIAGNOSIS — E559 Vitamin D deficiency, unspecified: Secondary | ICD-10-CM | POA: Diagnosis not present

## 2018-12-21 DIAGNOSIS — R7303 Prediabetes: Secondary | ICD-10-CM | POA: Diagnosis not present

## 2019-03-01 DIAGNOSIS — R3915 Urgency of urination: Secondary | ICD-10-CM | POA: Diagnosis not present

## 2019-03-01 DIAGNOSIS — E669 Obesity, unspecified: Secondary | ICD-10-CM | POA: Diagnosis not present

## 2019-03-01 DIAGNOSIS — I10 Essential (primary) hypertension: Secondary | ICD-10-CM | POA: Diagnosis not present

## 2019-03-01 DIAGNOSIS — G629 Polyneuropathy, unspecified: Secondary | ICD-10-CM | POA: Diagnosis not present

## 2019-03-01 DIAGNOSIS — Z720 Tobacco use: Secondary | ICD-10-CM | POA: Diagnosis not present

## 2019-03-01 DIAGNOSIS — E559 Vitamin D deficiency, unspecified: Secondary | ICD-10-CM | POA: Diagnosis not present

## 2019-03-01 DIAGNOSIS — R7303 Prediabetes: Secondary | ICD-10-CM | POA: Diagnosis not present

## 2019-03-01 DIAGNOSIS — J45909 Unspecified asthma, uncomplicated: Secondary | ICD-10-CM | POA: Diagnosis not present

## 2019-03-01 DIAGNOSIS — Z124 Encounter for screening for malignant neoplasm of cervix: Secondary | ICD-10-CM | POA: Diagnosis not present

## 2019-03-01 DIAGNOSIS — G43101 Migraine with aura, not intractable, with status migrainosus: Secondary | ICD-10-CM | POA: Diagnosis not present

## 2019-03-01 DIAGNOSIS — M549 Dorsalgia, unspecified: Secondary | ICD-10-CM | POA: Diagnosis not present

## 2019-05-14 DIAGNOSIS — I10 Essential (primary) hypertension: Secondary | ICD-10-CM | POA: Diagnosis not present

## 2019-05-14 DIAGNOSIS — R7303 Prediabetes: Secondary | ICD-10-CM | POA: Diagnosis not present

## 2019-05-14 DIAGNOSIS — E559 Vitamin D deficiency, unspecified: Secondary | ICD-10-CM | POA: Diagnosis not present

## 2019-05-14 DIAGNOSIS — M549 Dorsalgia, unspecified: Secondary | ICD-10-CM | POA: Diagnosis not present

## 2019-05-14 DIAGNOSIS — E669 Obesity, unspecified: Secondary | ICD-10-CM | POA: Diagnosis not present

## 2019-05-14 DIAGNOSIS — G43101 Migraine with aura, not intractable, with status migrainosus: Secondary | ICD-10-CM | POA: Diagnosis not present

## 2019-05-14 DIAGNOSIS — G629 Polyneuropathy, unspecified: Secondary | ICD-10-CM | POA: Diagnosis not present

## 2019-05-14 DIAGNOSIS — J45909 Unspecified asthma, uncomplicated: Secondary | ICD-10-CM | POA: Diagnosis not present

## 2019-05-14 DIAGNOSIS — J40 Bronchitis, not specified as acute or chronic: Secondary | ICD-10-CM | POA: Diagnosis not present

## 2019-05-14 DIAGNOSIS — Z720 Tobacco use: Secondary | ICD-10-CM | POA: Diagnosis not present

## 2019-05-16 DIAGNOSIS — I10 Essential (primary) hypertension: Secondary | ICD-10-CM | POA: Diagnosis not present

## 2019-05-16 DIAGNOSIS — R7303 Prediabetes: Secondary | ICD-10-CM | POA: Diagnosis not present

## 2019-05-16 DIAGNOSIS — E559 Vitamin D deficiency, unspecified: Secondary | ICD-10-CM | POA: Diagnosis not present

## 2019-05-16 DIAGNOSIS — E669 Obesity, unspecified: Secondary | ICD-10-CM | POA: Diagnosis not present

## 2019-05-16 DIAGNOSIS — Z720 Tobacco use: Secondary | ICD-10-CM | POA: Diagnosis not present

## 2019-05-16 DIAGNOSIS — G43101 Migraine with aura, not intractable, with status migrainosus: Secondary | ICD-10-CM | POA: Diagnosis not present

## 2019-05-16 DIAGNOSIS — J45909 Unspecified asthma, uncomplicated: Secondary | ICD-10-CM | POA: Diagnosis not present

## 2019-05-16 DIAGNOSIS — M549 Dorsalgia, unspecified: Secondary | ICD-10-CM | POA: Diagnosis not present

## 2019-05-16 DIAGNOSIS — G629 Polyneuropathy, unspecified: Secondary | ICD-10-CM | POA: Diagnosis not present

## 2019-07-16 DIAGNOSIS — M549 Dorsalgia, unspecified: Secondary | ICD-10-CM | POA: Diagnosis not present

## 2019-07-16 DIAGNOSIS — G43101 Migraine with aura, not intractable, with status migrainosus: Secondary | ICD-10-CM | POA: Diagnosis not present

## 2019-07-16 DIAGNOSIS — Z720 Tobacco use: Secondary | ICD-10-CM | POA: Diagnosis not present

## 2019-07-16 DIAGNOSIS — G629 Polyneuropathy, unspecified: Secondary | ICD-10-CM | POA: Diagnosis not present

## 2019-07-16 DIAGNOSIS — I10 Essential (primary) hypertension: Secondary | ICD-10-CM | POA: Diagnosis not present

## 2019-07-16 DIAGNOSIS — E559 Vitamin D deficiency, unspecified: Secondary | ICD-10-CM | POA: Diagnosis not present

## 2019-07-16 DIAGNOSIS — J45909 Unspecified asthma, uncomplicated: Secondary | ICD-10-CM | POA: Diagnosis not present

## 2019-07-16 DIAGNOSIS — R7303 Prediabetes: Secondary | ICD-10-CM | POA: Diagnosis not present

## 2019-07-16 DIAGNOSIS — E669 Obesity, unspecified: Secondary | ICD-10-CM | POA: Diagnosis not present

## 2019-07-16 DIAGNOSIS — M722 Plantar fascial fibromatosis: Secondary | ICD-10-CM | POA: Diagnosis not present

## 2019-07-16 DIAGNOSIS — R300 Dysuria: Secondary | ICD-10-CM | POA: Diagnosis not present

## 2019-08-11 ENCOUNTER — Other Ambulatory Visit: Payer: Self-pay

## 2019-08-11 ENCOUNTER — Emergency Department (HOSPITAL_COMMUNITY)
Admission: EM | Admit: 2019-08-11 | Discharge: 2019-08-11 | Disposition: A | Discharge status: Home / Self Care | Payer: Medicare Other | Attending: Emergency Medicine | Admitting: Emergency Medicine

## 2019-08-11 ENCOUNTER — Encounter (HOSPITAL_COMMUNITY): Payer: Self-pay | Admitting: Emergency Medicine

## 2019-08-11 DIAGNOSIS — Z5321 Procedure and treatment not carried out due to patient leaving prior to being seen by health care provider: Secondary | ICD-10-CM | POA: Insufficient documentation

## 2019-08-11 DIAGNOSIS — M79601 Pain in right arm: Secondary | ICD-10-CM | POA: Diagnosis not present

## 2019-08-11 LAB — URINALYSIS, ROUTINE W REFLEX MICROSCOPIC
Bacteria, UA: NONE SEEN
Bilirubin Urine: NEGATIVE
Glucose, UA: NEGATIVE mg/dL
Hgb urine dipstick: NEGATIVE
Ketones, ur: NEGATIVE mg/dL
Nitrite: NEGATIVE
Protein, ur: NEGATIVE mg/dL
Specific Gravity, Urine: 1.024 (ref 1.005–1.030)
pH: 6 (ref 5.0–8.0)

## 2019-08-11 LAB — I-STAT BETA HCG BLOOD, ED (MC, WL, AP ONLY): I-stat hCG, quantitative: <5 m[IU]/mL (ref <5)

## 2019-08-11 NOTE — ED Notes (Addendum)
Pt states she has to leave to pick daughter up from hair appt and will return tomorrow.  Encouraged her to stay or return after she picked her up and she declined.
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
## 2019-08-13 DIAGNOSIS — R5383 Other fatigue: Secondary | ICD-10-CM | POA: Diagnosis not present

## 2019-08-13 DIAGNOSIS — G629 Polyneuropathy, unspecified: Secondary | ICD-10-CM | POA: Diagnosis not present

## 2019-08-13 DIAGNOSIS — E559 Vitamin D deficiency, unspecified: Secondary | ICD-10-CM | POA: Diagnosis not present

## 2019-08-13 DIAGNOSIS — M549 Dorsalgia, unspecified: Secondary | ICD-10-CM | POA: Diagnosis not present

## 2019-08-13 DIAGNOSIS — E669 Obesity, unspecified: Secondary | ICD-10-CM | POA: Diagnosis not present

## 2019-08-13 DIAGNOSIS — J45909 Unspecified asthma, uncomplicated: Secondary | ICD-10-CM | POA: Diagnosis not present

## 2019-08-13 DIAGNOSIS — G43101 Migraine with aura, not intractable, with status migrainosus: Secondary | ICD-10-CM | POA: Diagnosis not present

## 2019-08-13 DIAGNOSIS — I10 Essential (primary) hypertension: Secondary | ICD-10-CM | POA: Diagnosis not present

## 2019-08-13 DIAGNOSIS — Z720 Tobacco use: Secondary | ICD-10-CM | POA: Diagnosis not present

## 2019-08-13 DIAGNOSIS — R7303 Prediabetes: Secondary | ICD-10-CM | POA: Diagnosis not present

## 2019-08-30 ENCOUNTER — Encounter: Payer: Self-pay | Admitting: Pulmonary Disease

## 2019-09-10 ENCOUNTER — Institutional Professional Consult (permissible substitution): Payer: Medicare Other | Admitting: Pulmonary Disease

## 2019-09-23 ENCOUNTER — Ambulatory Visit: Payer: Medicaid Other | Admitting: Pulmonary Disease

## 2019-09-23 ENCOUNTER — Encounter: Payer: Self-pay | Admitting: Pulmonary Disease

## 2019-09-23 VITALS — BP 130/88 | HR 80 | Temp 97.7°F | Ht 62.21 in | Wt 212.0 lb

## 2019-09-23 DIAGNOSIS — R0683 Snoring: Secondary | ICD-10-CM

## 2019-09-23 NOTE — Progress Notes (Signed)
[facility]  Chief Complaint  Patient presents with  . Consult    Sleep    Constitutional:  BP 130/88 (BP Location: Right Arm, Cuff Size: Normal)   Pulse 80   Temp 97.7 F (36.5 C) (Temporal)   Ht 5' 2.21" (1.58 m) Comment: with shoes  Wt 212 lb (96.2 kg)   SpO2 99%   BMI 38.52 kg/m   Past Medical History:  Migraine headaches, asthma  Brief Summary:  Debra Washington is a 38 y.o. female smoker with snoring and daytime sleepiness.  She has noticed trouble with her sleep for years.  This has been getting worse.  She has trouble staying awake during the day.  She can fall asleep at work.  She also has trouble staying focused while driving.  She has been told that she snores.  She will wake up feeling funny in her throat.  She doesn't sleep on her back.  She is working 3 different jobs for past few months.  Her sleep time varies.  She tries to go to sleep between 9 and 11 pm.  She falls asleep immediately.  She wakes up several times to use the bathroom.  She gets out of bed at 6 am.  She feels tired in the morning.  She frequently gets morning headache.  She does not use anything to help her fall sleep.  She drinks several cups of coffee and drinks energy drinks to stay awake.  She denies sleep walking, sleep talking, bruxism, or nightmares.  There is no history of restless legs.  She denies sleep hallucinations, sleep paralysis, or cataplexy.  The Epworth score is 21 out of 24.   Physical Exam:   Appearance - well kempt   ENMT - clear nasal mucosa, midline nasal  septum, no oral exudates, no LAN, trachea midline, MP 3, 2+ tonsils  Respiratory - normal chest wall, normal respiratory effort, no accessory muscle use, no wheeze/rales  CV - s1s2 regular rate and rhythm, no murmurs, no peripheral edema, radial pulses symmetric  GI - soft, non tender, no masses  Lymph - no adenopathy noted in neck and axillary areas  MSK - normal gait   Ext - no cyanosis, clubbing, or joint inflammation noted  Skin - no rashes, lesions, or ulcers  Neuro - normal strength, oriented x 3  Psych - normal mood and affect  Discussion:  She has snoring, sleep disruption, apnea, and daytime sleepiness.  Her BMI is > 35.  I am concerned she could have sleep apnea.  Assessment/Plan:   Snoring with excessive daytime sleepiness. - will need to arrange for a in lab sleep study due to insurance coverage  Obesity. - discussed how weight can impact sleep and risk for sleep disordered breathing - discussed options to assist with weight loss: combination of diet modification, cardiovascular and strength training exercises  Cardiovascular risk. - had an extensive discussion regarding the adverse health consequences related to untreated sleep disordered breathing - specifically discussed the risks for hypertension, coronary artery disease, cardiac dysrhythmias, cerebrovascular disease, and diabetes - lifestyle modification discussed  Safe driving practices. - discussed how sleep disruption can increase risk of accidents, particularly when driving - safe driving practices were discussed  Therapies for obstructive sleep apnea. - if the sleep study shows significant sleep apnea, then various therapies for treatment were reviewed: CPAP, oral appliance, and surgical interventions   Patient Instructions  Will arrange for in lab sleep study Will call to arrange  for follow up after sleep study reviewed     Chesley Mires, MD [place] Pager: [phone] 09/23/2019, 12:43 PM  Flow Sheet    Sleep tests:    Review of Systems:  Reviewed and negative.  Medications:   Allergies as of 09/23/2019      Reactions   Penicillins Other (See Comments)   Childhood allergy Has patient had a PCN reaction causing immediate rash, facial/tongue/throat swelling, SOB or lightheadedness with hypotension: No Has patient had a PCN  reaction causing severe rash involving mucus membranes or skin necrosis: No Has patient had a PCN reaction that required hospitalization: No Has patient had a PCN reaction occurring within the last 10 years: No If all of the above answers are "NO", then may proceed with Cephalosporin use.      Medication List       Accurate as of September 23, 2019 12:43 PM. If you have any questions, ask your nurse or doctor.        STOP taking these medications   ALPRAZolam 0.25 MG tablet Commonly known as: Xanax Stopped by: Chesley Mires, MD   predniSONE 20 MG tablet Commonly known as: DELTASONE Stopped by: Chesley Mires, MD   rizatriptan 10 MG disintegrating tablet Commonly known as: MAXALT-MLT Stopped by: Chesley Mires, MD     TAKE these medications   albuterol 108 (90 Base) MCG/ACT inhaler Commonly known as: VENTOLIN HFA Inhale 2 puffs into the lungs every 4 (four) hours as needed for wheezing or shortness of breath. For shortness of breath   cyclobenzaprine 10 MG tablet Commonly known as: FLEXERIL Take 1 tablet (10 mg total) by mouth 2 (two) times daily as needed for muscle spasms (For back spasms).   ibuprofen 800 MG tablet Commonly known as: ADVIL Take 1 tablet (800 mg total) by mouth 3 (three) times daily. What changed:   when to take this  reasons to take this   meloxicam 15 MG tablet Commonly known as: MOBIC Take 15 mg by mouth daily.       Past Surgical History:  She  has a past surgical history that includes Salpingoophorectomy (2006); Carpal tunnel release (Right, 01/2018); IR ANGIO INTRA EXTRACRAN SEL COM CAROTID INNOMINATE BILAT MOD SED (07/05/2018); and IR ANGIO VERTEBRAL SEL VERTEBRAL BILAT MOD SED (07/05/2018).  Family History:  Her family history includes Aneurysm in her father; Congestive Heart Failure in her maternal grandmother; Hypertension in her maternal grandmother; Migraines in her mother.  Social History:  She  reports that she has been smoking cigars.  She has never used smokeless tobacco. She reports previous drug use. She reports that she does not drink alcohol.

## 2019-09-23 NOTE — Patient Instructions (Signed)
Will arrange for in lab sleep study Will call to arrange for follow up after sleep study reviewed  

## 2019-10-08 ENCOUNTER — Other Ambulatory Visit (HOSPITAL_COMMUNITY): Payer: Medicaid Other

## 2019-10-11 ENCOUNTER — Encounter (HOSPITAL_BASED_OUTPATIENT_CLINIC_OR_DEPARTMENT_OTHER): Payer: Medicaid Other | Admitting: Pulmonary Disease

## 2019-10-14 ENCOUNTER — Other Ambulatory Visit (HOSPITAL_COMMUNITY)
Admission: RE | Admit: 2019-10-14 | Discharge: 2019-10-14 | Disposition: A | Discharge status: Home / Self Care | Payer: Medicaid Other | Source: Ambulatory Visit | Attending: Pulmonary Disease | Admitting: Pulmonary Disease

## 2019-10-14 DIAGNOSIS — Z01812 Encounter for preprocedural laboratory examination: Secondary | ICD-10-CM | POA: Diagnosis not present

## 2019-10-14 DIAGNOSIS — Z20828 Contact with and (suspected) exposure to other viral communicable diseases: Secondary | ICD-10-CM | POA: Diagnosis not present

## 2019-10-15 LAB — NOVEL CORONAVIRUS, NAA (HOSP ORDER, SEND-OUT TO REF LAB; TAT 18-24 HRS): SARS-CoV-2, NAA: NOT DETECTED

## 2019-10-17 ENCOUNTER — Other Ambulatory Visit: Payer: Self-pay

## 2019-10-17 ENCOUNTER — Ambulatory Visit (HOSPITAL_BASED_OUTPATIENT_CLINIC_OR_DEPARTMENT_OTHER): Payer: Medicaid Other | Attending: Pulmonary Disease | Admitting: Pulmonary Disease

## 2019-10-17 VITALS — Ht 61.0 in | Wt 212.0 lb

## 2019-10-17 DIAGNOSIS — R0683 Snoring: Secondary | ICD-10-CM | POA: Insufficient documentation

## 2019-10-17 DIAGNOSIS — G4733 Obstructive sleep apnea (adult) (pediatric): Secondary | ICD-10-CM | POA: Diagnosis present

## 2019-10-22 ENCOUNTER — Telehealth: Payer: Self-pay | Admitting: Pulmonary Disease

## 2019-10-22 DIAGNOSIS — R0683 Snoring: Secondary | ICD-10-CM | POA: Diagnosis not present

## 2019-10-22 NOTE — Telephone Encounter (Signed)
Called and spoke with patient relayed message per Dr. Halford Chessman. OV set up for 12/18 with Dr. Halford Chessman. Patient has no questions at this time.

## 2019-10-22 NOTE — Procedures (Signed)
    Patient Name: Debra Washington, Debra Washington Date: 10/17/2019 Gender: Female D.O.B: [birth year] Age (years): 33 Referring Provider: Chesley Mires MD, ABSM Height (inches): 61 Interpreting Physician: Chesley Mires MD, ABSM Weight (lbs): 212 RPSGT: Laren Everts BMI: 40 MRN: [id number] Neck Size: 14.00  CLINICAL INFORMATION Sleep Study Type: NPSG  Indication for sleep study: Morning Headaches, Obesity, Snoring  Epworth Sleepiness Score: 13  SLEEP STUDY TECHNIQUE As per the AASM Manual for the Scoring of Sleep and Associated Events v2.3 (April 2016) with a hypopnea requiring 4% desaturations.  The channels recorded and monitored were frontal, central and occipital EEG, electrooculogram (EOG), submentalis EMG (chin), nasal and oral airflow, thoracic and abdominal wall motion, anterior tibialis EMG, snore microphone, electrocardiogram, and pulse oximetry.  MEDICATIONS Medications self-administered by patient taken the night of the study : N/A  SLEEP ARCHITECTURE The study was initiated at 10:30:34 PM and ended at 5:01:40 AM.  Sleep onset time was 18.3 minutes and the sleep efficiency was 89.1%%. The total sleep time was 348.5 minutes.  Stage REM latency was 59.5 minutes.  The patient spent 3.2%% of the night in stage N1 sleep, 64.8%% in stage N2 sleep, 0.0%% in stage N3 and 32% in REM.  Alpha intrusion was absent.  Supine sleep was 50.50%.  RESPIRATORY PARAMETERS The overall apnea/hypopnea index (AHI) was 7.4 per hour. There were 5 total apneas, including 2 obstructive, 3 central and 0 mixed apneas. There were 38 hypopneas and 7 RERAs.  The AHI during Stage REM sleep was 21.0 per hour.  AHI while supine was 7.2 per hour.  The mean oxygen saturation was 94.3%. The minimum SpO2 during sleep was 79.0%.  soft snoring was noted during this study.  CARDIAC DATA The 2 lead EKG demonstrated sinus rhythm. The mean heart rate was 73.2 beats per minute. Other EKG findings include:  None.  LEG MOVEMENT DATA The total PLMS were 0 with a resulting PLMS index of 0.0. Associated arousal with leg movement index was 0.0 .  IMPRESSIONS - Mild obstructive sleep apnea with an AHI of 7.4 and SpO2 low of 79%.  DIAGNOSIS - Obstructive Sleep Apnea (327.23 [G47.33 ICD-10])  RECOMMENDATIONS - Additional therapies include weight loss, CPAP, oral appliance, or surgical assessment.  [Electronically signed] 10/22/2019 11:14 AM  Chesley Mires MD, ABSM Diplomate, American Board of Sleep Medicine   NPI: [id number]

## 2019-10-22 NOTE — Telephone Encounter (Signed)
PSG 10/17/19 >> AHI 7.4, SpO2 low 79%.  REM AHI 21.   Please inform her that her sleep study shows mild obstructive sleep apnea.  Please arrange for ROV with me or NP to discuss treatment options.

## 2019-10-25 ENCOUNTER — Encounter: Payer: Self-pay | Admitting: Pulmonary Disease

## 2019-10-25 ENCOUNTER — Ambulatory Visit (INDEPENDENT_AMBULATORY_CARE_PROVIDER_SITE_OTHER): Payer: Medicaid Other | Admitting: Pulmonary Disease

## 2019-10-25 VITALS — BP 120/80 | HR 72 | Temp 97.3°F | Ht 61.0 in | Wt 215.4 lb

## 2019-10-25 DIAGNOSIS — G4733 Obstructive sleep apnea (adult) (pediatric): Secondary | ICD-10-CM | POA: Diagnosis not present

## 2019-10-25 NOTE — Progress Notes (Signed)
[facility]  Chief Complaint  Patient presents with  . Follow-up    Patient is here to discuss sleep apnea     Constitutional:  BP 120/80 (BP Location: Right Arm, Patient Position: Sitting, Cuff Size: Normal)   Pulse 72   Temp (!) 97.3 F (36.3 C) (Temporal)   Ht 5\' 1"  (1.549 m)   Wt 215 lb 6.4 oz (97.7 kg)   SpO2 96% Comment: on RA  BMI 40.70 kg/m   Past Medical History:  Migraine headaches, asthma  Brief Summary:  Debra Washington is a 38 y.o. female smoker with obstructive sleep apnea.  She had sleep study.  This showed overall mild OSA, but moderate in REM sleep.   Physical Exam:   Appearance - well kempt   ENMT - no sinus tenderness, no nasal discharge, no oral exudate, MP 3, +2 tonsils  Neck - no masses, trachea midline, no thyromegaly, no elevation in JVP  Respiratory - normal appearance of chest wall, normal respiratory effort w/o accessory muscle use, no dullness on percussion, no wheezing or rales  CV - s1s2 regular rate and rhythm, no murmurs, no peripheral edema, radial pulses symmetric  GI - soft, non tender  Lymph - no adenopathy noted in neck and axillary areas  MSK - normal gait  Ext - no cyanosis, clubbing, or joint inflammation noted  Skin - no rashes, lesions, or ulcers  Neuro - normal strength, oriented x 3  Psych - normal mood and affect   Assessment/Plan:   Obstructive sleep apnea. - treatment options reviewed - health consequences of untreated sleep apnea discussed - driving precautions reviewed - she would like to work on weight loss first   Patient Instructions  Follow up in 6 months  A total of  18 minutes were spent face to face with the patient and more than half of that time involved counseling or coordination of care.   Chesley Mires, MD [facility] Pager: [phone] 10/25/2019, 12:57 PM  Flow Sheet    Sleep tests:  PSG 10/17/19 >> AHI 7.4, SpO2 low  79%.  REM AHI 21.  Medications:   Allergies as of 10/25/2019      Reactions   Penicillins Other (See Comments)   Childhood allergy Has patient had a PCN reaction causing immediate rash, facial/tongue/throat swelling, SOB or lightheadedness with hypotension: No Has patient had a PCN reaction causing severe rash involving mucus membranes or skin necrosis: No Has patient had a PCN reaction that required hospitalization: No Has patient had a PCN reaction occurring within the last 10 years: No If all of the above answers are "NO", then may proceed with Cephalosporin use.      Medication List       Accurate as of October 25, 2019 12:57 PM. If you have any questions, ask your nurse or doctor.        albuterol 108 (90 Base) MCG/ACT inhaler Commonly known as: VENTOLIN HFA Inhale 2 puffs into the lungs every 4 (four) hours as needed for wheezing or shortness of breath. For shortness of breath   cyclobenzaprine 10 MG tablet Commonly known as: FLEXERIL Take 1 tablet (10 mg total) by mouth 2 (two) times daily as needed for muscle spasms (For back spasms).   ibuprofen 800 MG tablet Commonly known as: ADVIL Take 1 tablet (800 mg total) by mouth 3 (three) times daily. What changed:   when to take this  reasons to take this   meloxicam  15 MG tablet Commonly known as: MOBIC Take 15 mg by mouth daily.       Past Surgical History:  She  has a past surgical history that includes Salpingoophorectomy (2006); Carpal tunnel release (Right, 01/2018); IR ANGIO INTRA EXTRACRAN SEL COM CAROTID INNOMINATE BILAT MOD SED (07/05/2018); and IR ANGIO VERTEBRAL SEL VERTEBRAL BILAT MOD SED (07/05/2018).  Family History:  Her family history includes Aneurysm in her father; Congestive Heart Failure in her maternal grandmother; Hypertension in her maternal grandmother; Migraines in her mother.  Social History:  She  reports that she has been smoking cigars. She has never used smokeless tobacco. She  reports previous drug use. She reports that she does not drink alcohol.

## 2019-10-25 NOTE — Patient Instructions (Signed)
Follow up in 6 months 

## 2020-02-24 ENCOUNTER — Encounter (HOSPITAL_BASED_OUTPATIENT_CLINIC_OR_DEPARTMENT_OTHER): Payer: Self-pay | Admitting: Emergency Medicine

## 2020-02-24 ENCOUNTER — Other Ambulatory Visit: Payer: Self-pay

## 2020-02-24 ENCOUNTER — Emergency Department (HOSPITAL_BASED_OUTPATIENT_CLINIC_OR_DEPARTMENT_OTHER): Payer: Medicaid Other

## 2020-02-24 ENCOUNTER — Emergency Department (HOSPITAL_BASED_OUTPATIENT_CLINIC_OR_DEPARTMENT_OTHER)
Admission: EM | Admit: 2020-02-24 | Discharge: 2020-02-24 | Disposition: A | Discharge status: Home / Self Care | Payer: Medicaid Other | Attending: Emergency Medicine | Admitting: Emergency Medicine

## 2020-02-24 DIAGNOSIS — F17290 Nicotine dependence, other tobacco product, uncomplicated: Secondary | ICD-10-CM | POA: Diagnosis not present

## 2020-02-24 DIAGNOSIS — M79661 Pain in right lower leg: Secondary | ICD-10-CM | POA: Insufficient documentation

## 2020-02-24 DIAGNOSIS — R2243 Localized swelling, mass and lump, lower limb, bilateral: Secondary | ICD-10-CM | POA: Diagnosis not present

## 2020-02-24 DIAGNOSIS — Z79899 Other long term (current) drug therapy: Secondary | ICD-10-CM | POA: Diagnosis not present

## 2020-02-24 DIAGNOSIS — M79669 Pain in unspecified lower leg: Secondary | ICD-10-CM

## 2020-02-24 DIAGNOSIS — M6282 Rhabdomyolysis: Secondary | ICD-10-CM

## 2020-02-24 DIAGNOSIS — M79662 Pain in left lower leg: Secondary | ICD-10-CM | POA: Insufficient documentation

## 2020-02-24 DIAGNOSIS — J45909 Unspecified asthma, uncomplicated: Secondary | ICD-10-CM | POA: Diagnosis not present

## 2020-02-24 HISTORY — DX: Obesity, unspecified: E66.9

## 2020-02-24 LAB — CBC WITH DIFFERENTIAL/PLATELET
Abs Immature Granulocytes: 0.01 10*3/uL (ref 0.00–0.07)
Basophils Absolute: 0 10*3/uL (ref 0.0–0.1)
Basophils Relative: 1 %
Eosinophils Absolute: 0.3 10*3/uL (ref 0.0–0.5)
Eosinophils Relative: 5 %
HCT: 45.8 % (ref 36.0–46.0)
Hemoglobin: 14.3 g/dL (ref 12.0–15.0)
Immature Granulocytes: 0 %
Lymphocytes Relative: 34 %
Lymphs Abs: 2.1 10*3/uL (ref 0.7–4.0)
MCH: 28.8 pg (ref 26.0–34.0)
MCHC: 31.2 g/dL (ref 30.0–36.0)
MCV: 92.3 fL (ref 80.0–100.0)
Monocytes Absolute: 0.4 10*3/uL (ref 0.1–1.0)
Monocytes Relative: 7 %
Neutro Abs: 3.4 10*3/uL (ref 1.7–7.7)
Neutrophils Relative %: 53 %
Platelets: 259 10*3/uL (ref 150–400)
RBC: 4.96 MIL/uL (ref 3.87–5.11)
RDW: 13.6 % (ref 11.5–15.5)
WBC: 6.2 10*3/uL (ref 4.0–10.5)
nRBC: 0 % (ref 0.0–0.2)

## 2020-02-24 LAB — BASIC METABOLIC PANEL
Anion gap: 9 (ref 5–15)
BUN: 17 mg/dL (ref 6–20)
CO2: 25 mmol/L (ref 22–32)
Calcium: 9.2 mg/dL (ref 8.9–10.3)
Chloride: 104 mmol/L (ref 98–111)
Creatinine, Ser: 0.88 mg/dL (ref 0.44–1.00)
GFR calc Af Amer: >60 mL/min (ref >60)
GFR calc non Af Amer: >60 mL/min (ref >60)
Glucose, Bld: 90 mg/dL (ref 70–99)
Potassium: 4.4 mmol/L (ref 3.5–5.1)
Sodium: 138 mmol/L (ref 135–145)

## 2020-02-24 LAB — CK: Total CK: 6643 U/L — ABNORMAL HIGH (ref 38–234)

## 2020-02-24 MED ORDER — SODIUM CHLORIDE 0.9 % IV BOLUS
1000.0000 mL | Freq: Once | INTRAVENOUS | Status: AC
  Administered 2020-02-24: 1000 mL via INTRAVENOUS

## 2020-02-24 NOTE — ED Provider Notes (Signed)
[facility] EMERGENCY DEPARTMENT Provider Note   CSN: [id number] Arrival date & time: 02/24/20  1020     History Chief Complaint  Patient presents with  . Leg Pain    Debra Washington is a 39 y.o. female.  She is complaining of pain in her calves that started yesterday when she woke up.  She is also noticed some swelling more left than right.  She says it felt like a charley horse and caused her to collapse.  Worse with any type of movement.  No numbness.  No chest pain or shortness of breath.  She saw her PCP today who sent her here to get an ultrasound to make sure she does not have a blood clot.  No prior history of DVT or PE.  No recent trauma.  No recent immobilization.  Not on OCPs or other hormonal therapy.  Last menstrual period 10 days ago  The history is provided by the patient.  Leg Pain Lower extremity pain location: bilateral calves. Time since incident:  2 days Injury: no   Pain details:    Quality:  Aching and sharp   Radiates to:  Does not radiate   Severity:  Moderate   Onset quality:  Sudden   Duration:  2 days   Timing:  Constant   Progression:  Unchanged Chronicity:  New Dislocation: no   Associated symptoms: swelling   Associated symptoms: no back pain, no fever, no neck pain, no numbness and no tingling        Past Medical History:  Diagnosis Date  . Asthma   . Hx of ectopic pregnancy 2006  . Migraine   . Obesity     Patient Active Problem List   Diagnosis Date Noted  . Snoring 10/17/2019  . Migraine without aura and without status migrainosus, not intractable 05/02/2018  . Vaginitis and vulvovaginitis 03/08/2011  . Exposure to viral disease 03/08/2011  . Low back pain 03/08/2011  . Asthma, intermittent 03/08/2011  . TOBACCO ABUSE 12/22/2009  . COMMON MIGRAINE 12/22/2009    Past Surgical History:  Procedure Laterality Date  . CARPAL TUNNEL RELEASE Right 01/2018  . IR ANGIO INTRA EXTRACRAN SEL COM CAROTID INNOMINATE BILAT MOD  SED  07/05/2018  . IR ANGIO VERTEBRAL SEL VERTEBRAL BILAT MOD SED  07/05/2018  . SALPINGOOPHORECTOMY  2006   pt not sure if her ovaries were removed      OB History   No obstetric history on file.     Family History  Problem Relation Age of Onset  . Migraines Mother   . Aneurysm Father   . Congestive Heart Failure Maternal Grandmother   . Hypertension Maternal Grandmother     Social History   Tobacco Use  . Smoking status: Current Every Day Smoker    Types: Cigars  . Smokeless tobacco: Never Used  . Tobacco comment: 3 black and milds a day  Substance Use Topics  . Alcohol use: No  . Drug use: Not Currently    Comment: smoked weed 2 years ago    Home Medications Prior to Admission medications   Medication Sig Start Date End Date Taking? Authorizing Provider  albuterol (PROVENTIL HFA;VENTOLIN HFA) 108 (90 BASE) MCG/ACT inhaler Inhale 2 puffs into the lungs every 4 (four) hours as needed for wheezing or shortness of breath. For shortness of breath    [provider]  cyclobenzaprine (FLEXERIL) 10 MG tablet Take 1 tablet (10 mg total) by mouth 2 (two) times daily as needed  for muscle spasms (For back spasms). 03/25/15   Lurene Shadow, PA-C  ibuprofen (ADVIL,MOTRIN) 800 MG tablet Take 1 tablet (800 mg total) by mouth 3 (three) times daily. Patient taking differently: Take 800 mg by mouth 3 (three) times daily as needed.  07/11/17   Elson Areas, PA-C  meloxicam (MOBIC) 15 MG tablet Take 15 mg by mouth daily.    [provider]    Allergies    Penicillins  Review of Systems   Review of Systems  Constitutional: Negative for fever.  HENT: Negative for sore throat.   Eyes: Negative for visual disturbance.  Respiratory: Negative for shortness of breath.   Cardiovascular: Positive for leg swelling. Negative for chest pain.  Gastrointestinal: Negative for abdominal pain.  Genitourinary: Negative for dysuria.  Musculoskeletal: Negative for back pain and neck  pain.  Skin: Negative for rash.  Neurological: Negative for headaches.    Physical Exam Updated Vital Signs BP 136/85 (BP Location: Right Arm)   Pulse 81   Temp 97.8 F (36.6 C) (Oral)   Resp 16   Ht 5\' 1"  (1.549 m)   Wt 98.2 kg   LMP 02/17/2020   SpO2 98%   BMI 40.93 kg/m   Physical Exam Vitals and nursing note reviewed.  Constitutional:      General: She is not in acute distress.    Appearance: She is well-developed.  HENT:     Head: Normocephalic and atraumatic.  Eyes:     Conjunctiva/sclera: Conjunctivae normal.  Cardiovascular:     Rate and Rhythm: Normal rate and regular rhythm.     Heart sounds: No murmur.  Pulmonary:     Effort: Pulmonary effort is normal. No respiratory distress.     Breath sounds: Normal breath sounds.  Abdominal:     Palpations: Abdomen is soft.     Tenderness: There is no abdominal tenderness.  Musculoskeletal:        General: Tenderness present.     Cervical back: Neck supple.     Right lower leg: Edema present.     Left lower leg: Edema present.     Comments: She is tenderness through out both of her calves left greater than right.  Her Achilles is intact.  Knee and ankle nontender.  1+ edema.  Distal pulses sensation and motor intact.  Skin:    General: Skin is warm and dry.     Capillary Refill: Capillary refill takes less than 2 seconds.  Neurological:     General: No focal deficit present.     Mental Status: She is alert.     Sensory: No sensory deficit.     Motor: No weakness.     ED Results / Procedures / Treatments   Labs (all labs ordered are listed, but only abnormal results are displayed) Labs Reviewed  CK - Abnormal; Notable for the following components:      Result Value   Total CK 6,643 (*)    All other components within normal limits  BASIC METABOLIC PANEL  CBC WITH DIFFERENTIAL/PLATELET    EKG None  Radiology 04/18/2020 Venous Img Lower Bilateral (DVT)  Result Date: 02/24/2020 CLINICAL DATA:  39 year old  female with bilateral lower extremity pain for the past 2 days. EXAM: BILATERAL LOWER EXTREMITY VENOUS DOPPLER ULTRASOUND TECHNIQUE: Gray-scale sonography with graded compression, as well as color Doppler and duplex ultrasound were performed to evaluate the lower extremity deep venous systems from the level of the common femoral vein and including the common  femoral, femoral, profunda femoral, popliteal and calf veins including the posterior tibial, peroneal and gastrocnemius veins when visible. The superficial great saphenous vein was also interrogated. Spectral Doppler was utilized to evaluate flow at rest and with distal augmentation maneuvers in the common femoral, femoral and popliteal veins. COMPARISON:  None. FINDINGS: RIGHT LOWER EXTREMITY Common Femoral Vein: No evidence of thrombus. Normal compressibility, respiratory phasicity and response to augmentation. Saphenofemoral Junction: No evidence of thrombus. Normal compressibility and flow on color Doppler imaging. Profunda Femoral Vein: No evidence of thrombus. Normal compressibility and flow on color Doppler imaging. Femoral Vein: No evidence of thrombus. Normal compressibility, respiratory phasicity and response to augmentation. Popliteal Vein: No evidence of thrombus. Normal compressibility, respiratory phasicity and response to augmentation. Calf Veins: No evidence of thrombus. Normal compressibility and flow on color Doppler imaging. Superficial Great Saphenous Vein: No evidence of thrombus. Normal compressibility. Venous Reflux:  None. Other Findings:  None. LEFT LOWER EXTREMITY Common Femoral Vein: No evidence of thrombus. Normal compressibility, respiratory phasicity and response to augmentation. Saphenofemoral Junction: No evidence of thrombus. Normal compressibility and flow on color Doppler imaging. Profunda Femoral Vein: No evidence of thrombus. Normal compressibility and flow on color Doppler imaging. Femoral Vein: No evidence of thrombus.  Normal compressibility, respiratory phasicity and response to augmentation. Popliteal Vein: No evidence of thrombus. Normal compressibility, respiratory phasicity and response to augmentation. Calf Veins: No evidence of thrombus. Normal compressibility and flow on color Doppler imaging. Superficial Great Saphenous Vein: No evidence of thrombus. Normal compressibility. Venous Reflux:  None. Other Findings:  None. IMPRESSION: No evidence of deep venous thrombosis in either lower extremity. Electronically Signed   By: Malachy Moan M.D.   On: 02/24/2020 11:25    Procedures Procedures (including critical care time)  Medications Ordered in ED Medications  sodium chloride 0.9 % bolus 1,000 mL (0 mLs Intravenous Stopped 02/24/20 1621)    ED Course  I have reviewed the triage vital signs and the nursing notes.  Pertinent labs & imaging results that were available during my care of the patient were reviewed by me and considered in my medical decision making (see chart for details).  Clinical Course as of Feb 24 1708  Mon Feb 24, 2020  1333 Patient's ultrasound did not show any evidence of blood clot.  Her labs are fairly normal with normal renal function normal white count normal hemoglobin.  Unfortunately her CPK is elevated at 6600.  She does not recall any significant muscle trauma.  We will get some IV fluids.  She is asking to eat and drink and I think that is reasonable.   [MB]    Clinical Course User Index [MB] Terrilee Files, MD   MDM Rules/Calculators/A&P                     This patient complains of bilateral calf pain; this involves an extensive number of treatment Options and is a complaint that carries with it a high risk of complications and Morbidity. The differential includes DVT, muscle tear, rhabdo, strain, dehydration, renal failure  I ordered, reviewed and interpreted labs, which included CBC normal chemistry normal CPK elevated at 6600 I ordered medication IV  fluids I ordered imaging studies which included duplex bilateral lower extremities and I independently    visualized and interpreted imaging which showed no evidence of DVT  Previous records obtained and reviewed in epic  After the interventions stated above, I reevaluated the patient and found improved.  Her renal function  is normal so I think she can continue to orally rehydrate and get over this.  Return instructions discussed   Final Clinical Impression(s) / ED Diagnoses Final diagnoses:  Bilateral calf pain  Non-traumatic rhabdomyolysis    Rx / DC Orders ED Discharge Orders    None       Hayden Rasmussen, MD 02/24/20 [phone]

## 2020-02-24 NOTE — ED Triage Notes (Signed)
Bilateral lower leg pain since yesterday. Worse on left.  Sent here from pmd for [place] to r/o DVT.

## 2020-02-24 NOTE — ED Notes (Signed)
Pt reports burning, painful IV site with flush, to seek alternate site for IV fluid infusion.

## 2020-02-24 NOTE — ED Notes (Signed)
ED Provider at bedside. 

## 2020-02-24 NOTE — Discharge Instructions (Addendum)
You were seen in the emergency room for pain in both your calves.  You had an ultrasound that showed no evidence of a blood clot.  Your muscle breakdown enzyme was elevated and you received some IV fluids.  Will be important for you to stay well-hydrated.  Follow-up with your doctor return to the emergency department if any worsening or concerning symptoms  Please discuss with your family doctor that you had signs of muscle breakdown.  Asked them about your new vibrating machine that you have been using to lose weight and see if they recommend you continue using it.  Please do not use it until you discuss this with them.

## 2020-02-24 NOTE — ED Provider Notes (Signed)
I received the patient in signout from Dr. Charm Barges, briefly the patient is a 39 year old female with a chief complaints of pain to the bilateral lower extremities.  Was sent by her family doctor for rule out bilateral DVT.  This is negative.  She however was found to have a CK of 6000.  No renal dysfunction plan for a bolus of IV fluids reassessment and likely discharge home.  Patient feels fine after IV fluid administration.  After further discussion with her she tells me that she has not had any recent medication changes, she is on hydrochlorothiazide, gabapentin, Mobic.  She also has recently gotten a new machine that vibrates to try and lose weight.  I cautioned her to stop using that until she discusses it with her family doctor.  We will have her push fluids at home.   Melene Plan, DO 02/24/20 1557

## 2020-02-24 NOTE — ED Notes (Signed)
Pt to u/s

## 2020-04-09 ENCOUNTER — Ambulatory Visit: Payer: Medicaid Other | Attending: Internal Medicine

## 2020-04-09 DIAGNOSIS — Z23 Encounter for immunization: Secondary | ICD-10-CM

## 2020-04-09 NOTE — Progress Notes (Signed)
   Covid-19 Vaccination Clinic  Name:  Debra Washington    MRN: [id number] DOB: [birth year]  04/09/2020  Ms. Debra Washington was observed post Covid-19 immunization for 15 minutes without incident. She was provided with Vaccine Information Sheet and instruction to access the V-Safe system.   Ms. Debra Washington was instructed to call 911 with any severe reactions post vaccine: Marland Kitchen Difficulty breathing  . Swelling of face and throat  . A fast heartbeat  . A bad rash all over body  . Dizziness and weakness   Immunizations Administered    Name Date Dose VIS Date Route   Pfizer COVID-19 Vaccine 04/09/2020  1:29 PM 0.3 mL 01/01/2019 Intramuscular   Manufacturer: [employer], [employer]   Lot: [id number]   NDC: [address]-5

## 2020-05-04 ENCOUNTER — Ambulatory Visit: Payer: Medicaid Other | Attending: Internal Medicine

## 2020-05-04 DIAGNOSIS — Z23 Encounter for immunization: Secondary | ICD-10-CM

## 2020-05-04 NOTE — Progress Notes (Signed)
° °  Covid-19 Vaccination Clinic  Name:  Debra Washington    MRN: [id number] DOB: September 02, 1981  05/04/2020  [name] was observed post Covid-19 immunization for 15 minutes without incident. She was provided with Vaccine Information Sheet and instruction to access the V-Safe system.   [name] was instructed to call 911 with any severe reactions post vaccine:  Difficulty breathing   Swelling of face and throat   A fast heartbeat   A bad rash all over body   Dizziness and weakness   Immunizations Administered    Name Date Dose VIS Date Route   Pfizer COVID-19 Vaccine 05/04/2020  2:47 PM 0.3 mL 01/01/2019 Intramuscular   Manufacturer: [employer], [employer]   Lot: [id number]   NDC: [address]-5

## 2020-06-16 ENCOUNTER — Emergency Department (HOSPITAL_COMMUNITY)
Admission: EM | Admit: 2020-06-16 | Discharge: 2020-06-16 | Disposition: A | Discharge status: Home / Self Care | Payer: No Typology Code available for payment source | Attending: Emergency Medicine | Admitting: Emergency Medicine

## 2020-06-16 ENCOUNTER — Encounter (HOSPITAL_COMMUNITY): Payer: Self-pay | Admitting: Emergency Medicine

## 2020-06-16 ENCOUNTER — Other Ambulatory Visit: Payer: Self-pay

## 2020-06-16 DIAGNOSIS — M549 Dorsalgia, unspecified: Secondary | ICD-10-CM | POA: Insufficient documentation

## 2020-06-16 DIAGNOSIS — Y9389 Activity, other specified: Secondary | ICD-10-CM | POA: Insufficient documentation

## 2020-06-16 DIAGNOSIS — Y999 Unspecified external cause status: Secondary | ICD-10-CM | POA: Diagnosis not present

## 2020-06-16 DIAGNOSIS — J45909 Unspecified asthma, uncomplicated: Secondary | ICD-10-CM | POA: Diagnosis not present

## 2020-06-16 DIAGNOSIS — Z041 Encounter for examination and observation following transport accident: Secondary | ICD-10-CM | POA: Insufficient documentation

## 2020-06-16 DIAGNOSIS — Y92410 Unspecified street and highway as the place of occurrence of the external cause: Secondary | ICD-10-CM | POA: Insufficient documentation

## 2020-06-16 DIAGNOSIS — F17290 Nicotine dependence, other tobacco product, uncomplicated: Secondary | ICD-10-CM | POA: Insufficient documentation

## 2020-06-16 MED ORDER — KETOROLAC TROMETHAMINE 60 MG/2ML IM SOLN
60.0000 mg | Freq: Once | INTRAMUSCULAR | Status: AC
  Administered 2020-06-16: 60 mg via INTRAMUSCULAR
  Filled 2020-06-16: qty 2

## 2020-06-16 MED ORDER — METHOCARBAMOL 500 MG PO TABS: 500.0000 mg | ORAL_TABLET | Freq: Two times a day (BID) | ORAL | 0 refills | Status: DC
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
## 2020-06-16 NOTE — Discharge Instructions (Addendum)
Take NSAIDs or Tylenol as needed for the next week. Take this medicine with food. Take muscle relaxer at bedtime to help you sleep. This medicine makes you drowsy so do not take before driving or work Use a heating pad for sore muscles - use for 20 minutes several times a day Try gentle range of motion exercises Please follow up with your primary care doctor if your symptoms do not improve  Return for worsening symptoms Please use warm baths and gentle exercise provided

## 2020-06-16 NOTE — ED Notes (Signed)
Patient to the lobby with belongings.
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
## 2020-06-29 IMAGING — XA IR ANGIO VETEBRAL SEL VERTEBRAL BILAT MOD SED
1 series · 12 of 24 positions shown · IV contrast (IODINE)
Comparison: MRI MRA of the brain of 05/16/2018, and CT angiogram of
the head and neck of 06/04/2018.

CLINICAL DATA: Left-sided pulsatile tinnitus. Family history of
intracranial aneurysms.
INDICATION: Pulsatile tinnitus on the left. Family history of intracranial
aneurysms.

EXAM:
BILATERAL COMMON CAROTID AND INNOMINATE ANGIOGRAPHY; IR ANGIO
VERTEBRAL SEL VERTEBRAL BILAT MOD SED
TECHNIQUE: Informed written consent was obtained from the patient after a
thorough discussion of the procedural risks, benefits and
alternatives. All questions were addressed. Maximal Sterile Barrier
Technique was utilized including caps, mask, sterile gowns, sterile
gloves, sterile drape, hand hygiene and skin antiseptic. A timeout
was performed prior to the initiation of the procedure.

[Series 300: dr. (person_name) · 12 of 295 slices shown]
[im 13/295]
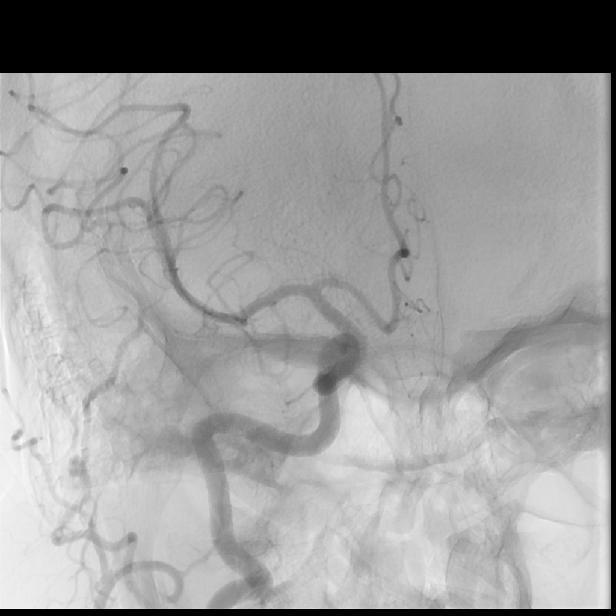
[im 39/295]
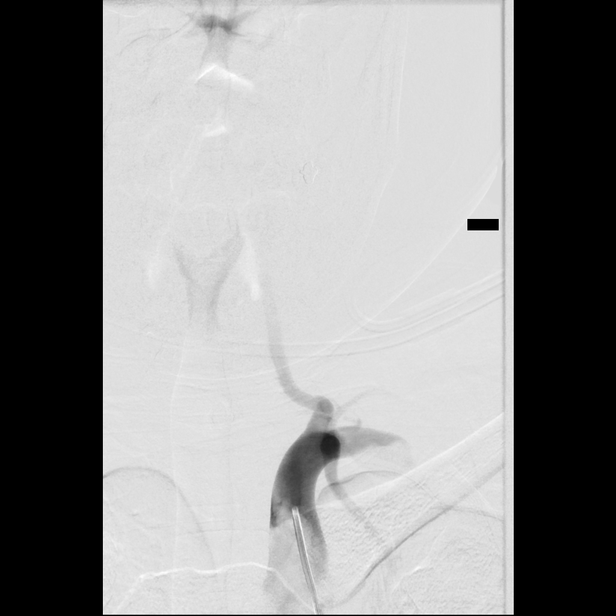
[im 64/295]
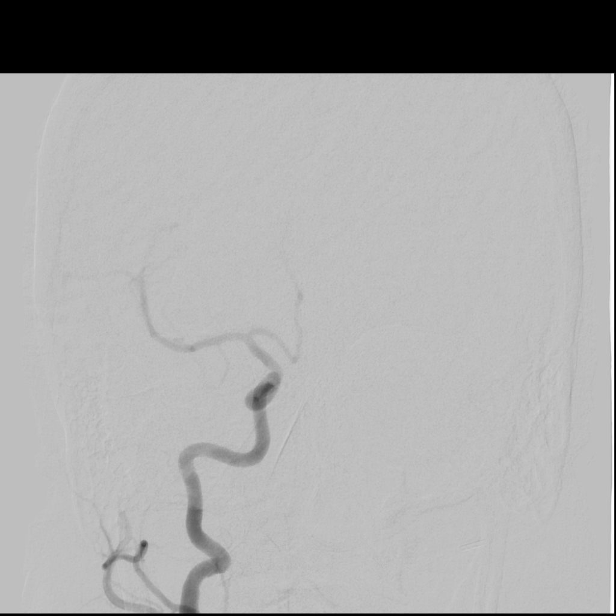
[im 90/295]
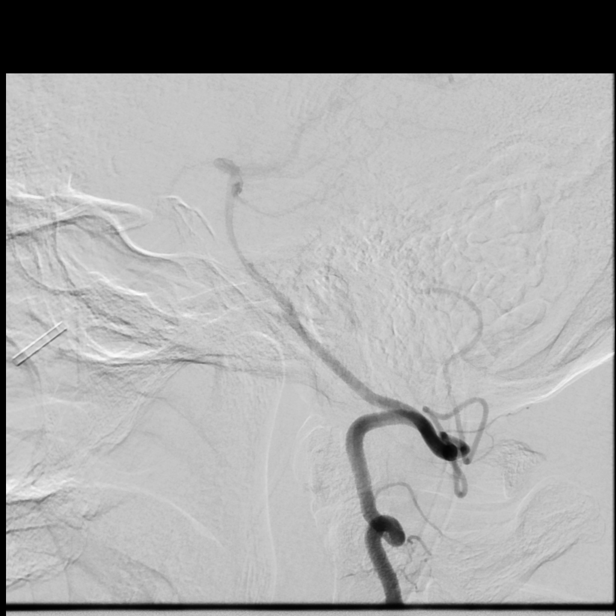
[im 116/295]
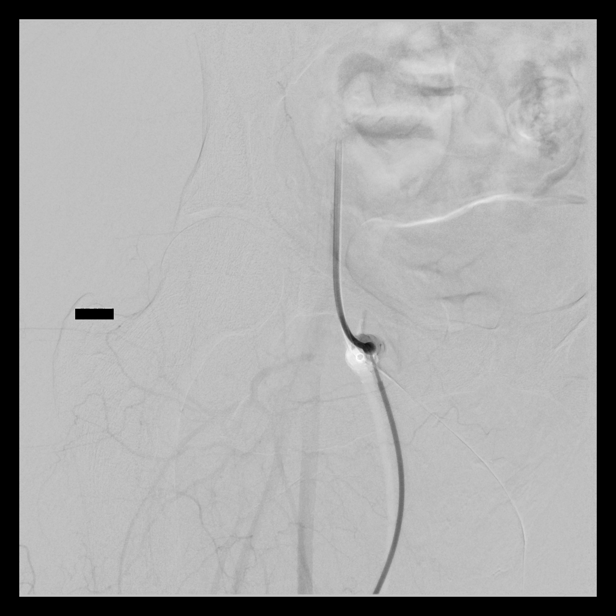
[im 141/295]
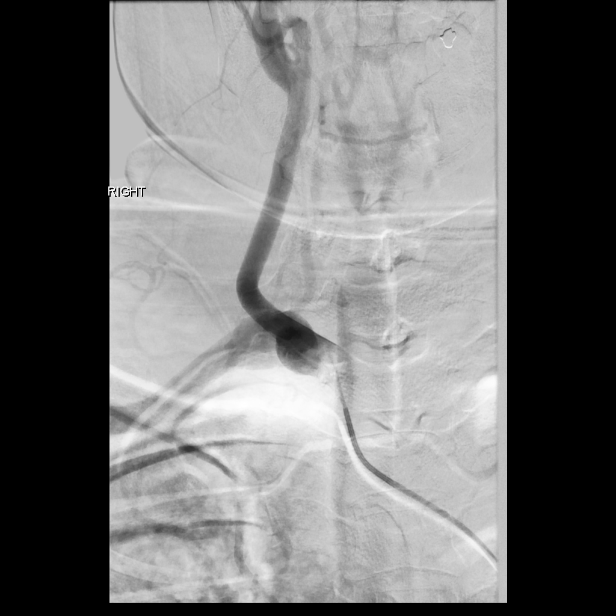
[im 167/295]
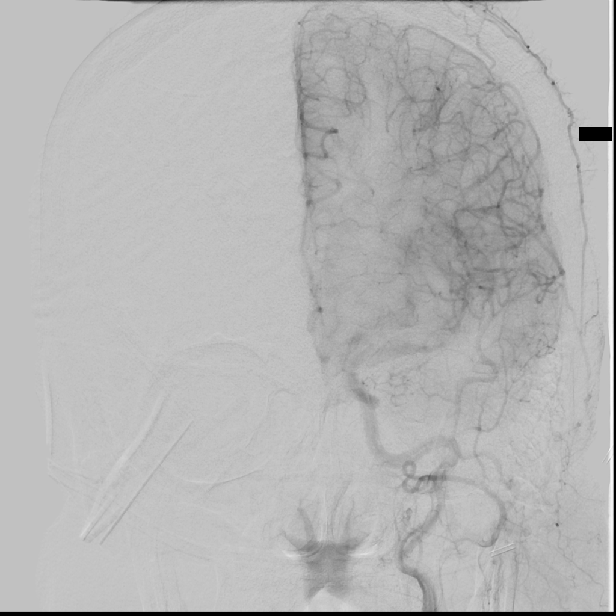
[im 192/295]
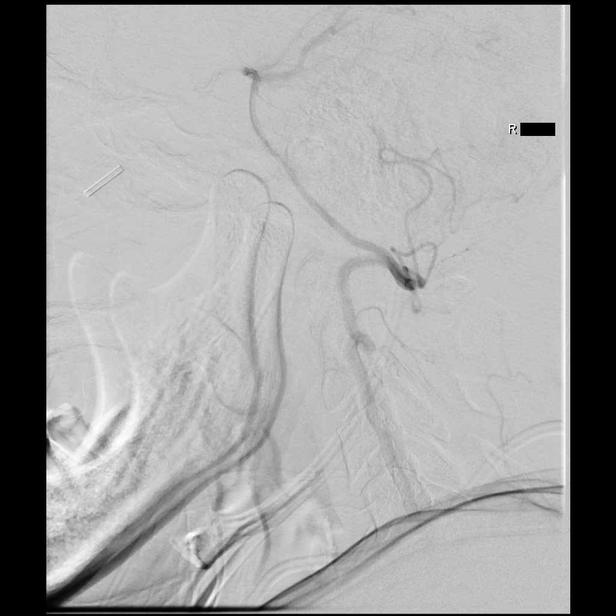
[im 218/295]
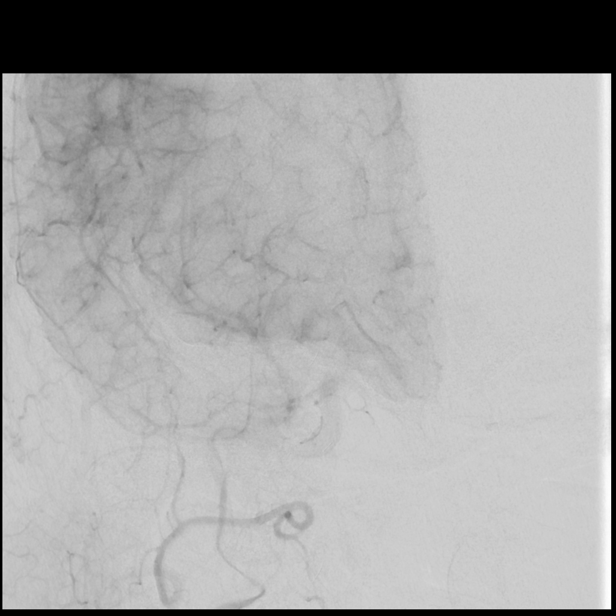
[im 243/295]
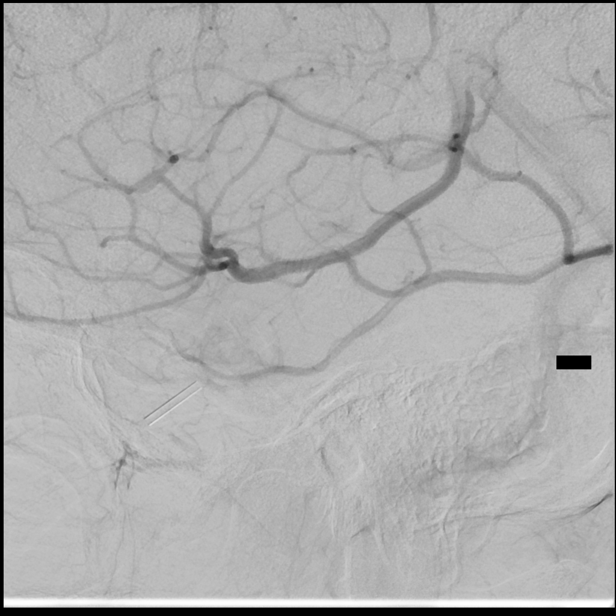
[im 269/295]
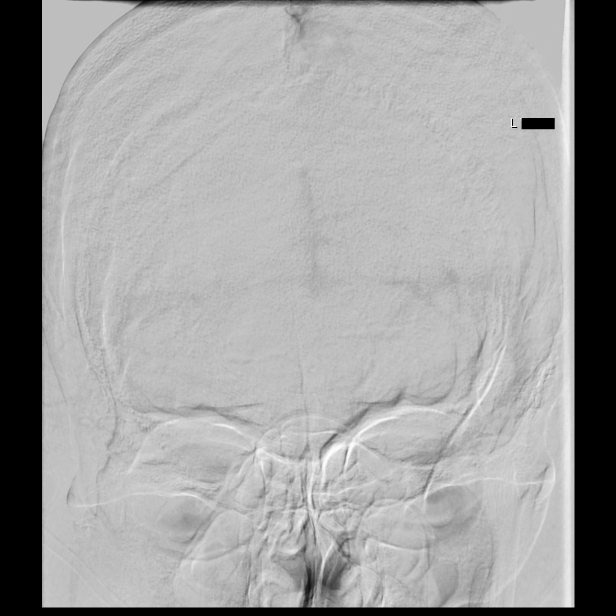
[im 295/295]
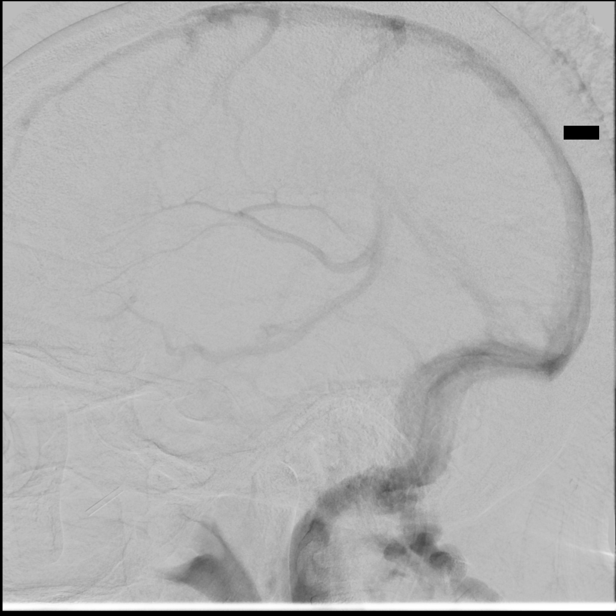

[12 of 24 positions shown; findings below may reference images not displayed]

MEDICATIONS:
Heparin 7222 units IV; no antibiotic was administered within 1 hour
of the procedure.

ANESTHESIA/SEDATION:
Versed 1.5 mg IV; Fentanyl 50 mcg IV

Moderate Sedation Time:  36 minutes

The patient was continuously monitored during the procedure by the
interventional radiology nurse under my direct supervision.

CONTRAST:  Isovue 300 approximately 60 mL

FLUOROSCOPY TIME:  Fluoroscopy Time: 9 minutes 18 seconds (4496
mGy).

COMPLICATIONS:
None immediate.
The right groin was prepped and draped in the usual sterile fashion.
Thereafter using modified Seldinger technique, transfemoral access
into the right common femoral artery was obtained without
difficulty. Over a 0.035 inch guidewire, a 5 French Pinnacle sheath
was inserted. Through this, and also over 0.035 inch guidewire, a 5
French JB 1 catheter was advanced to the aortic arch region and
selectively positioned in the right common carotid artery, , the
right vertebral artery, the left common carotid artery and the left
vertebral artery.
FINDINGS: The right common carotid arteriogram demonstrates the right external
carotid artery and its major branches to be widely patent. The right
internal carotid artery at the bulb to the cranial skull base also
demonstrates wide patency.

There is moderate tortuosity of the petrous, cavernous and cavernous
carotid artery.

Mild stenosis of the supraclinoid right ICA is noted.

The right middle cerebral artery and the right anterior cerebral
artery opacify into the capillary and venous phases.

The origin of the right vertebral artery is normal. The vessel is
seen to opacify to the cranial skull base.

Wide patency is seen of the right vertebrobasilar junction and the
right posterior-inferior cerebellar artery.

The opacified portion of the basilar artery, the posterior cerebral
arteries, the superior cerebellar arteries and the anterior-inferior
cerebellar arteries is normal into the capillary and venous phases.
Unopacified blood is seen in the basilar artery from the
contralateral vertebral artery.

The venous phase demonstrates patency of the posterior [DATE] of the
superior sagittal sinus, the internal cerebral veins, vein of Nedd,
the straight sinus, the transverse sinuses, and the sigmoid sinuses.

There is mild prominence of the left internal jugular vein just
distal to the jugular bulb. Prominent veins are seen in the region
just below the right internal jugular bulb.

The left common carotid arteriogram demonstrates the left external
carotid artery and its major branches to be widely patent.

The left internal carotid artery at the bulb to the cranial skull
base demonstrates wide patency.

There is mild tortuosity of the petrous cavernous segments without
an underlying significant stenosis.

The left middle cerebral artery and the left anterior cerebral
artery opacify into the capillary and venous phases.

The venous phase again demonstrates wide patency of the major dural
venous sinuses with mild prominence of the internal jugular vein at
the bulb and the internal jugular vein at the skull base. Prominent
sub occipital veins are seen on the right side as mentioned earlier.

The right vertebral artery origin is widely patent. The vessel is
seen to opacify normally to the cranial skull base. Wide patency is
seen of the left posterior-inferior cerebellar artery and the left
vertebrobasilar basilar junction.

The opacified basilar artery, the posterior cerebral arteries, the
superior cerebellar arteries and the anterior-inferior cerebellar
arteries demonstrates opacification into the capillary and venous
phases. Unopacified blood is seen in the basilar artery from the
contralateral vertebral artery.
IMPRESSION: No gross evidence of intracranial aneurysms, dural AV fistula,
previous malformations, or of occlusions or dissections.

Mild narrowing of the supraclinoid internal carotid arteries
bilaterally probably related to arteriosclerotic disease.

Prominence of the left jugular bulb and the left internal jugular
vein. Moderately prominent suboccipital veins on the right. These
probably represent a normal anatomic variation. No gross evidence of
dural venous sinus filling defects or of occlusions or stenosis
seen.

PLAN:
The patient advised to maintain adequate hydration and to stop
smoking. She has a follow-up with the referring doctor.

## 2021-04-20 ENCOUNTER — Other Ambulatory Visit: Payer: Self-pay | Admitting: Internal Medicine

## 2021-04-20 DIAGNOSIS — Z1231 Encounter for screening mammogram for malignant neoplasm of breast: Secondary | ICD-10-CM

## 2021-06-30 ENCOUNTER — Other Ambulatory Visit: Payer: Self-pay

## 2021-06-30 ENCOUNTER — Emergency Department (HOSPITAL_BASED_OUTPATIENT_CLINIC_OR_DEPARTMENT_OTHER)
Admission: EM | Admit: 2021-06-30 | Discharge: 2021-06-30 | Disposition: A | Discharge status: Home / Self Care | Payer: Medicaid Other | Attending: Emergency Medicine | Admitting: Emergency Medicine

## 2021-06-30 ENCOUNTER — Encounter (HOSPITAL_BASED_OUTPATIENT_CLINIC_OR_DEPARTMENT_OTHER): Payer: Self-pay

## 2021-06-30 DIAGNOSIS — F17290 Nicotine dependence, other tobacco product, uncomplicated: Secondary | ICD-10-CM | POA: Diagnosis not present

## 2021-06-30 DIAGNOSIS — J4520 Mild intermittent asthma, uncomplicated: Secondary | ICD-10-CM | POA: Insufficient documentation

## 2021-06-30 DIAGNOSIS — L0231 Cutaneous abscess of buttock: Secondary | ICD-10-CM | POA: Insufficient documentation

## 2021-06-30 DIAGNOSIS — L03317 Cellulitis of buttock: Secondary | ICD-10-CM | POA: Insufficient documentation

## 2021-06-30 DIAGNOSIS — R2241 Localized swelling, mass and lump, right lower limb: Secondary | ICD-10-CM | POA: Diagnosis present

## 2021-06-30 MED ORDER — OXYCODONE-ACETAMINOPHEN 5-325 MG PO TABS
1.0000 | ORAL_TABLET | Freq: Once | ORAL | Status: AC
  Administered 2021-06-30: 1 via ORAL
  Filled 2021-06-30: qty 1

## 2021-06-30 MED ORDER — LIDOCAINE HCL (PF) 1 % IJ SOLN
10.0000 mL | Freq: Once | INTRAMUSCULAR | Status: AC
  Administered 2021-06-30: 10 mL
  Filled 2021-06-30: qty 10

## 2021-06-30 MED ORDER — IBUPROFEN 400 MG PO TABS
400.0000 mg | ORAL_TABLET | Freq: Once | ORAL | Status: AC
  Administered 2021-06-30: 400 mg via ORAL
  Filled 2021-06-30: qty 1

## 2021-06-30 MED ORDER — SULFAMETHOXAZOLE-TRIMETHOPRIM 800-160 MG PO TABS: 1.0000 | ORAL_TABLET | Freq: Two times a day (BID) | ORAL | 0 refills | Status: AC

## 2021-06-30 NOTE — ED Triage Notes (Signed)
Pt c/o "spider bite" to left buttock x 3 days-states she was sent from PCP "to have it drained"-NAD-slow gait

## 2021-06-30 NOTE — Discharge Instructions (Addendum)
Take antibiotic as prescribed.  Recommend following up with your primary doctor or coming back to the emergency room for recheck in 48 hours.  Your abscess may need additional drainage.  If in the meantime you have worsening swelling, redness, fever, come back to ER for reassessment.  Recommend doing warm compresses 3-4 times daily.  Take antibiotic as prescribed.

## 2021-06-30 NOTE — ED Notes (Signed)
Patient Alert and oriented to baseline. Stable and ambulatory to baseline. Patient verbalized understanding of the discharge instructions.  Patient belongings were taken by the patient.   

## 2021-06-30 NOTE — ED Provider Notes (Addendum)
[facility] EMERGENCY DEPARTMENT Provider Note   CSN: [id number] Arrival date & time: 06/30/21  1128     History Chief Complaint  Patient presents with   Insect Bite    Debra [place] is a 40 y.o. female.  Presents to ER with concern for insect bite.  Patient states that about 3 days ago she noted some swelling and pain to her left buttocks.  Seems to has been steadily worsening.  Went to her primary care doctor today and was told that she needs to come to the ER to have it drained.  States that her primary care doctor sent antibiotics but she is unsure what was prescribed.  She denies any other chronic medical problems.  No fevers or chills.  HPI     Past Medical History:  Diagnosis Date   Asthma    Hx of ectopic pregnancy 2006   Migraine    Obesity     Patient Active Problem List   Diagnosis Date Noted   Snoring 10/17/2019   Migraine without aura and without status migrainosus, not intractable 05/02/2018   Vaginitis and vulvovaginitis 03/08/2011   Exposure to viral disease 03/08/2011   Low back pain 03/08/2011   Asthma, intermittent 03/08/2011   TOBACCO ABUSE 12/22/2009   COMMON MIGRAINE 12/22/2009    Past Surgical History:  Procedure Laterality Date   CARPAL TUNNEL RELEASE Right 01/2018   IR ANGIO INTRA EXTRACRAN SEL COM CAROTID INNOMINATE BILAT MOD SED  07/05/2018   IR ANGIO VERTEBRAL SEL VERTEBRAL BILAT MOD SED  07/05/2018   SALPINGOOPHORECTOMY  2006   pt not sure if her ovaries were removed      OB History   No obstetric history on file.     Family History  Problem Relation Age of Onset   Migraines Mother    Aneurysm Father    Congestive Heart Failure Maternal Grandmother    Hypertension Maternal Grandmother     Social History   Tobacco Use   Smoking status: Every Day    Types: Cigars   Smokeless tobacco: Never   Tobacco comments:    3 black and milds a day  Vaping Use   Vaping Use: Never used  Substance Use Topics   Alcohol use:  No   Drug use: Not Currently    Home Medications Prior to Admission medications   Medication Sig Start Date End Date Taking? Authorizing Provider  sulfamethoxazole-trimethoprim (BACTRIM DS) 800-160 MG tablet Take 1 tablet by mouth 2 (two) times daily for 7 days. 06/30/21 07/07/21 Yes Avantika Shere, Quitman Livings, MD  albuterol (PROVENTIL HFA;VENTOLIN HFA) 108 (90 BASE) MCG/ACT inhaler Inhale 2 puffs into the lungs every 4 (four) hours as needed for wheezing or shortness of breath. For shortness of breath    [provider]  cyclobenzaprine (FLEXERIL) 10 MG tablet Take 1 tablet (10 mg total) by mouth 2 (two) times daily as needed for muscle spasms (For back spasms). 03/25/15   Lurene Shadow, PA-C  ibuprofen (ADVIL,MOTRIN) 800 MG tablet Take 1 tablet (800 mg total) by mouth 3 (three) times daily. Patient taking differently: Take 800 mg by mouth 3 (three) times daily as needed.  07/11/17   Elson Areas, PA-C  meloxicam (MOBIC) 15 MG tablet Take 15 mg by mouth daily.    [provider]  methocarbamol (ROBAXIN) 500 MG tablet Take 1 tablet (500 mg total) by mouth 2 (two) times daily. 06/16/20   Mare Ferrari, PA-C    Allergies  Penicillins  Review of Systems   Review of Systems  Constitutional:  Negative for diaphoresis, fatigue and fever.  Genitourinary:        Buttocks pain  All other systems reviewed and are negative.  Physical Exam Updated Vital Signs BP 115/65   Pulse 66   Temp 98 F (36.7 C)   Resp 15   Ht 5\' 1"  (1.549 m)   Wt 98 kg   LMP 06/07/2021   SpO2 99%   BMI 40.81 kg/m   Physical Exam Vitals and nursing note reviewed. Exam conducted with a chaperone present.  Constitutional:      General: She is not in acute distress.    Appearance: She is well-developed.  HENT:     Head: Normocephalic and atraumatic.  Eyes:     Conjunctiva/sclera: Conjunctivae normal.  Cardiovascular:     Rate and Rhythm: Normal rate.     Heart sounds: No murmur heard. Pulmonary:      Effort: Pulmonary effort is normal. No respiratory distress.  Genitourinary:    Comments: 08/07/2021 Over the right superior buttocks there is approximately 3 cm diameter area of mild swelling, induration and slight erythema Musculoskeletal:     Cervical back: Neck supple.  Skin:    General: Skin is warm and dry.  Neurological:     General: No focal deficit present.     Mental Status: She is alert.  Psychiatric:        Mood and Affect: Mood normal.        Thought Content: Thought content normal.    ED Results / Procedures / Treatments   Labs (all labs ordered are listed, but only abnormal results are displayed) Labs Reviewed - No data to display  EKG None  Radiology No results found.  Procedures .Recruitment consultantIncision and Drainage  Date/Time: 06/30/2021 2:38 PM Performed by: 07/02/2021, MD Authorized by: Milagros Loll, MD   Consent:    Consent obtained:  Verbal   Risks discussed:  Bleeding, damage to other organs, infection, incomplete drainage and pain   Alternatives discussed:  No treatment, delayed treatment, alternative treatment, observation and referral Universal protocol:    Immediately prior to procedure, a time out was called: yes   Location:    Type:  Abscess   Size:  2cm   Location:  Anogenital   Anogenital location: R buttocks. Pre-procedure details:    Skin preparation:  Povidone-iodine Sedation:    Sedation type:  None Anesthesia:    Anesthesia method:  Local infiltration   Local anesthetic:  Lidocaine 1% w/o epi Procedure type:    Complexity:  Simple Procedure details:    Incision types:  Single straight   Drainage:  Purulent and bloody   Drainage amount:  Moderate   Wound treatment:  Wound left open   Packing materials:  None Post-procedure details:    Procedure completion:  Tolerated well, no immediate complications   Medications Ordered in ED Medications  lidocaine (PF) (XYLOCAINE) 1 % injection 10 mL (10 mLs  Infiltration Given by Other 06/30/21 1222)  oxyCODONE-acetaminophen (PERCOCET/ROXICET) 5-325 MG per tablet 1 tablet (1 tablet Oral Given 06/30/21 1221)  ibuprofen (ADVIL) tablet 400 mg (400 mg Oral Given 06/30/21 1310)    ED Course  I have reviewed the triage vital signs and the nursing notes.  Pertinent labs & imaging results that were available during my care of the patient were reviewed by me and considered in my medical decision making (see chart for details).  MDM Rules/Calculators/A&P                         40 year old lady presents to ER with concern for abscess to her right buttocks.  On exam patient does have an area of induration and mild swelling and slight redness concerning for abscess versus cellulitis.  Performed I&D and had small purulent drainage.  Suspect this is mostly cellulitis at present.  Recommend patient take course of antibiotics and do frequent warm compresses and return here or PCP in 48 hours for wound recheck.   After the discussed management above, the patient was determined to be safe for discharge.  The patient was in agreement with this plan and all questions regarding their care were answered.  ED return precautions were discussed and the patient will return to the ED with any significant worsening of condition.  Final Clinical Impression(s) / ED Diagnoses Final diagnoses:  Cellulitis and abscess of buttock    Rx / DC Orders ED Discharge Orders          Ordered    sulfamethoxazole-trimethoprim (BACTRIM DS) 800-160 MG tablet  2 times daily        06/30/21 1312             Milagros Loll, MD 06/30/21 1438    Milagros Loll, MD 06/30/21 1440

## 2022-01-14 ENCOUNTER — Other Ambulatory Visit: Payer: Self-pay | Admitting: Physician Assistant

## 2022-01-14 DIAGNOSIS — Z1231 Encounter for screening mammogram for malignant neoplasm of breast: Secondary | ICD-10-CM

## 2022-02-18 IMAGING — US US EXTREM LOW VENOUS
1 series · 13 of 24 positions shown · non-contrast
Comparison: None.

CLINICAL DATA: 39-year-old female with bilateral lower extremity
pain for the past 2 days.



[Series 1: us extrem low venous · 13 of 53 slices shown]
[im 1/53]
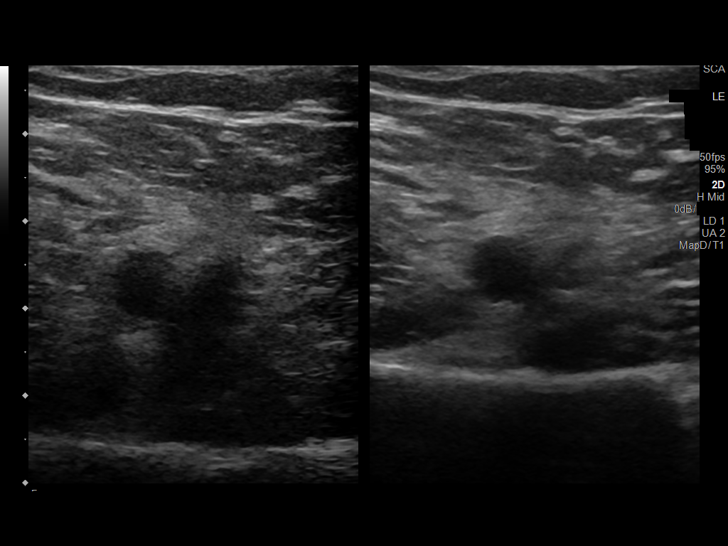
[im 5/53]
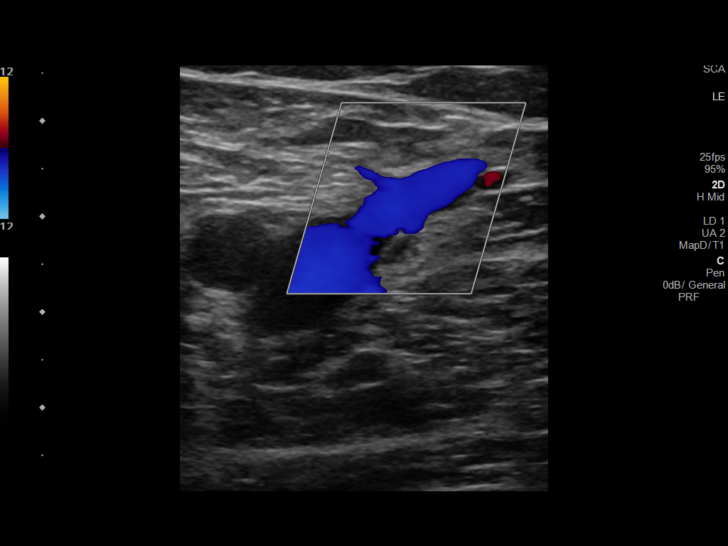
[im 10/53]
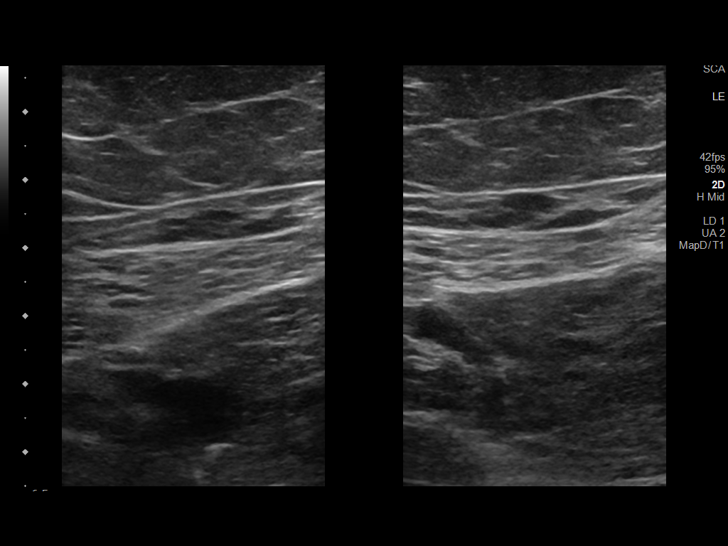
[im 14/53]
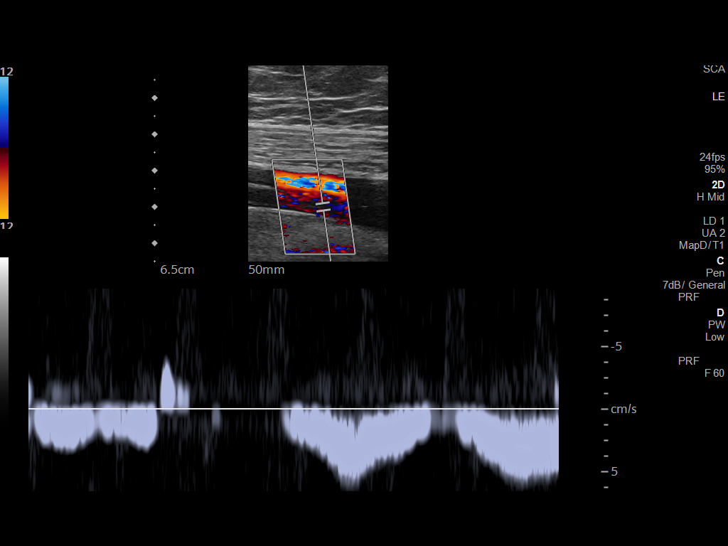
[im 19/53]
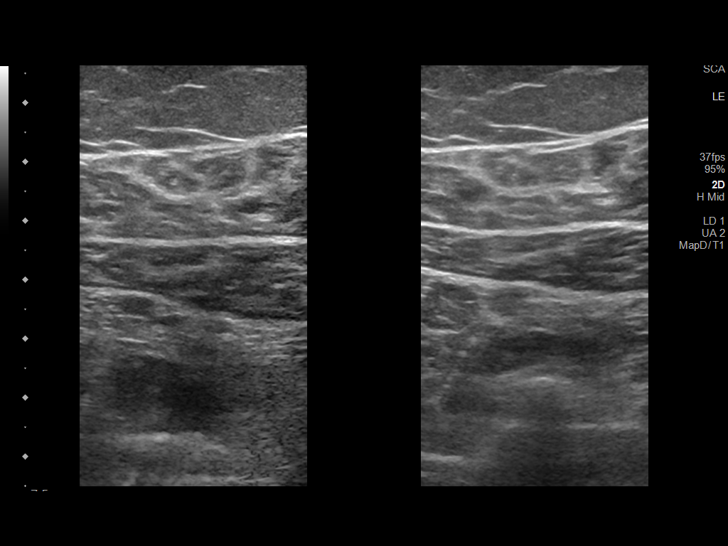
[im 23/53]
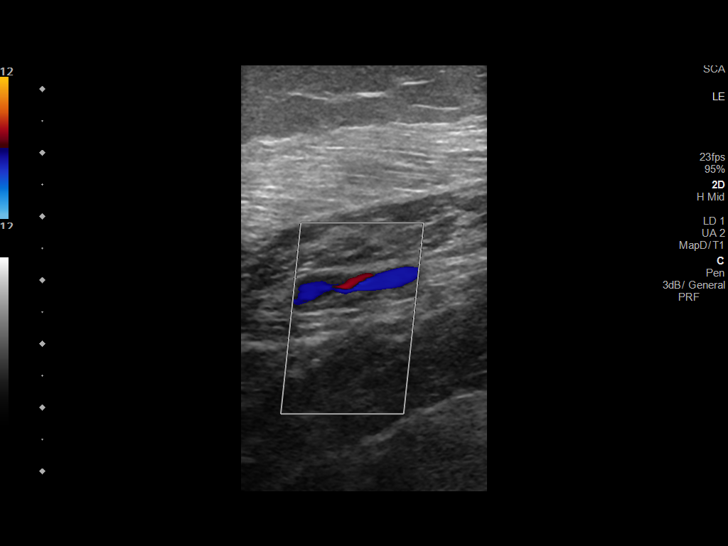
[im 28/53]
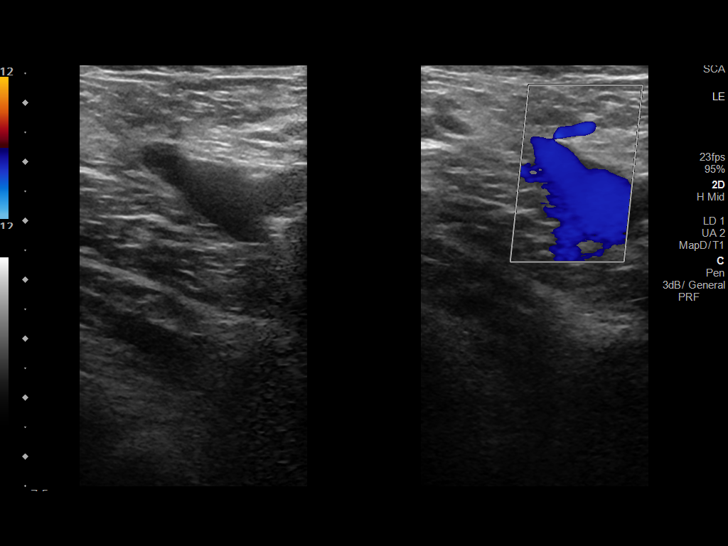
[im 30/53]
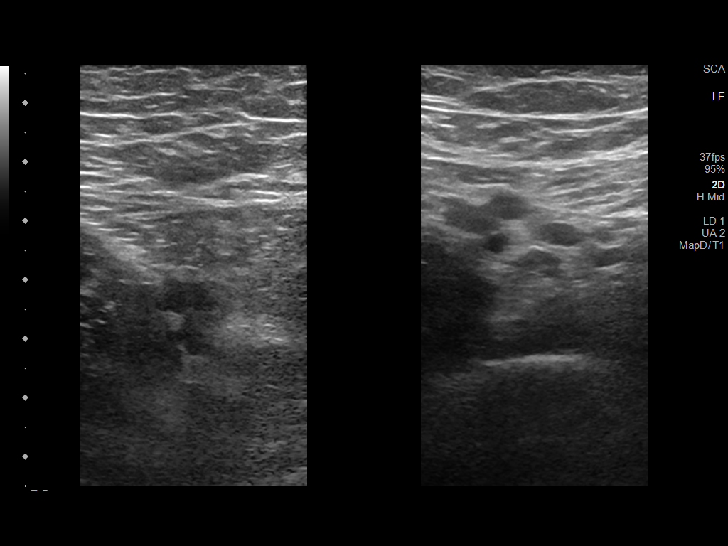
[im 34/53]
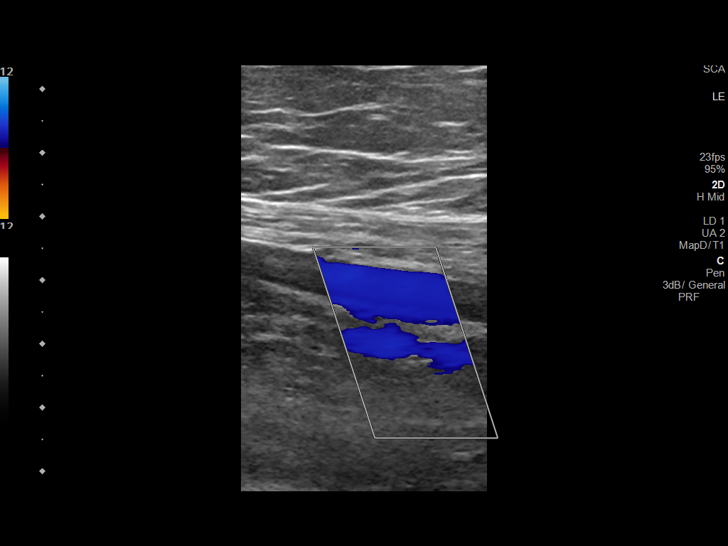
[im 39/53]
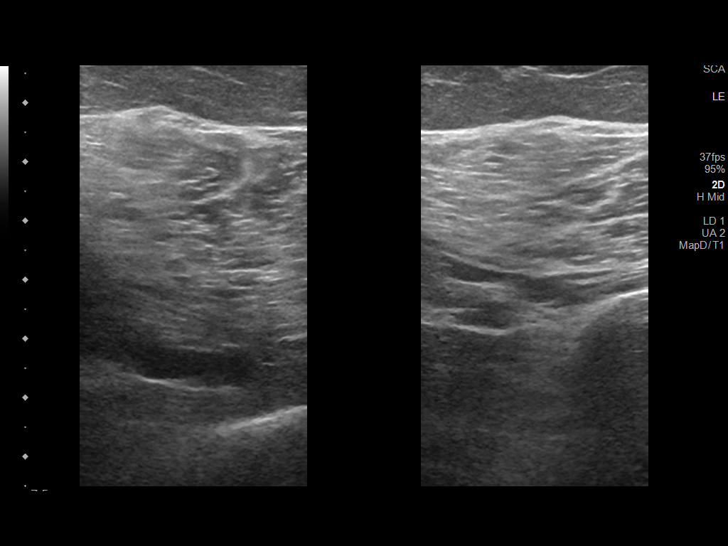
[im 43/53]
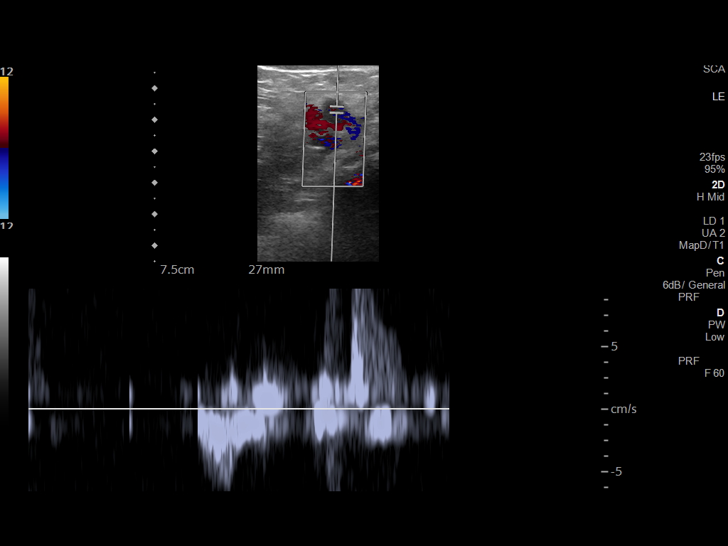
[im 48/53]
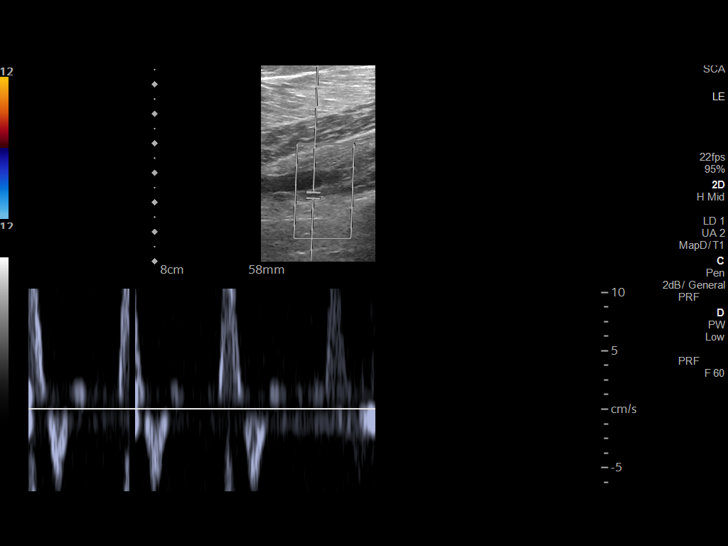
[im 53/53]
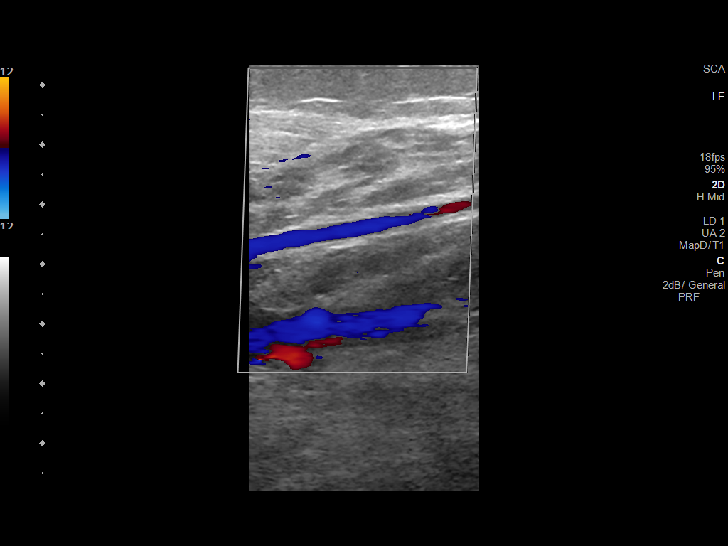

[13 of 24 positions shown; findings below may reference images not displayed]

FINDINGS: RIGHT LOWER EXTREMITY

Common Femoral Vein: No evidence of thrombus. Normal
compressibility, respiratory phasicity and response to augmentation.

Saphenofemoral Junction: No evidence of thrombus. Normal
compressibility and flow on color Doppler imaging.

Profunda Femoral Vein: No evidence of thrombus. Normal
compressibility and flow on color Doppler imaging.

Femoral Vein: No evidence of thrombus. Normal compressibility,
respiratory phasicity and response to augmentation.

Popliteal Vein: No evidence of thrombus. Normal compressibility,
respiratory phasicity and response to augmentation.

Calf Veins: No evidence of thrombus. Normal compressibility and flow
on color Doppler imaging.

Superficial Great Saphenous Vein: No evidence of thrombus. Normal
compressibility.

Venous Reflux:  None.

Other Findings:  None.

LEFT LOWER EXTREMITY

Common Femoral Vein: No evidence of thrombus. Normal
compressibility, respiratory phasicity and response to augmentation.

Saphenofemoral Junction: No evidence of thrombus. Normal
compressibility and flow on color Doppler imaging.

Profunda Femoral Vein: No evidence of thrombus. Normal
compressibility and flow on color Doppler imaging.

Femoral Vein: No evidence of thrombus. Normal compressibility,
respiratory phasicity and response to augmentation.

Popliteal Vein: No evidence of thrombus. Normal compressibility,
respiratory phasicity and response to augmentation.

Calf Veins: No evidence of thrombus. Normal compressibility and flow
on color Doppler imaging.

Superficial Great Saphenous Vein: No evidence of thrombus. Normal
compressibility.

Venous Reflux:  None.

Other Findings:  None.
IMPRESSION: No evidence of deep venous thrombosis in either lower extremity.

## 2022-03-24 ENCOUNTER — Ambulatory Visit (HOSPITAL_COMMUNITY)
Admission: EM | Admit: 2022-03-24 | Discharge: 2022-03-24 | Disposition: A | Discharge status: Home / Self Care | Payer: Medicaid Other | Attending: Family Medicine | Admitting: Family Medicine

## 2022-03-24 ENCOUNTER — Encounter (HOSPITAL_COMMUNITY): Payer: Self-pay

## 2022-03-24 DIAGNOSIS — N644 Mastodynia: Secondary | ICD-10-CM | POA: Diagnosis not present

## 2022-03-24 MED ORDER — LIDOCAINE-PRILOCAINE 2.5-2.5 % EX CREA: 1.0000 "application " | TOPICAL_CREAM | CUTANEOUS | 0 refills | Status: DC | PRN

## 2022-03-24 MED ORDER — MUPIROCIN 2 % EX OINT: 1.0000 "application " | TOPICAL_OINTMENT | Freq: Two times a day (BID) | CUTANEOUS | 0 refills | Status: DC

## 2022-03-24 NOTE — ED Triage Notes (Signed)
C/o left breast pain x 2 days. Pt slept with her wire bra on last night.

## 2022-03-28 NOTE — ED Provider Notes (Signed)
[facility] CARE [facility]ENTER   [id number] 03/24/22 Arrival Time: 1941  ASSESSMENT & PLAN:  1. Nipple pain    No sign of abscess formation. No nipple bleeding or drainage. Will treat for pain and possible superficial skin infection. Close f/u if not improving within 24-48 hours.  Discharge Medication List as of 03/24/2022  8:20 PM     START taking these medications   Details  lidocaine-prilocaine (EMLA) cream Apply 1 application. topically as needed., Starting Thu 03/24/2022, Normal    mupirocin ointment (BACTROBAN) 2 % Apply 1 application. topically 2 (two) times daily., Starting Thu 03/24/2022, Normal         Follow-up Information     [facility] .   Specialty: Urgent Care Why: If worsening or failing to improve as anticipated. Contact information: [address] [place] [address] [phone]                Reviewed expectations re: course of current medical issues. Questions answered. Outlined signs and symptoms indicating need for more acute intervention. Understanding verbalized. After Visit Summary given.   SUBJECTIVE: History from: Patient. Debra [place] is a 41 y.o. female. Reports: LEFT nipple pian; x 24-48 hours; stable "but so sensitive and painful". No nipple drainage or bleeding. Slept with bra on before pain started; does not usually. Denies: fever. Normal PO intake without n/v/d.  OBJECTIVE:  Vitals:   03/24/22 2003 03/24/22 2004  BP:  (!) 154/113  Pulse: 83 67  Resp: 18 16  Temp: (!) 97.5 F (36.4 C) 98.3 F (36.8 C)  TempSrc: Oral Oral  SpO2: 100% 100%    General appearance: alert; no distress Skin: warm and dry; chaperone present for breast exam; L medial nipple is very painful to touch per her report; maybe ~1-2 mm small bump on nipple that could represent beginning of focal infection; no bleeding or drainage Neurologic: normal gait Psychological: alert and cooperative; normal mood and  affect  Allergies  Allergen Reactions   Penicillins Other (See Comments)    Childhood allergy Has patient had a PCN reaction causing immediate rash, facial/tongue/throat swelling, SOB or lightheadedness with hypotension: No Has patient had a PCN reaction causing severe rash involving mucus membranes or skin necrosis: No Has patient had a PCN reaction that required hospitalization: No Has patient had a PCN reaction occurring within the last 10 years: No If all of the above answers are "NO", then may proceed with Cephalosporin use.     Past Medical History:  Diagnosis Date   Asthma    Hx of ectopic pregnancy 2006   Migraine    Obesity    Social History   Socioeconomic History   Marital status: Single    Spouse name: Not on file   Number of children: 3   Years of education: Not on file   Highest education level: Not on file  Occupational History   Not on file  Tobacco Use   Smoking status: Every Day    Types: Cigars   Smokeless tobacco: Never   Tobacco comments:    3 black and milds a day  Vaping Use   Vaping Use: Never used  Substance and Sexual Activity   Alcohol use: No   Drug use: Not Currently   Sexual activity: Yes    Partners: Male    Birth control/protection: None  Other Topics Concern   Not on file  Social History Narrative   3 children, one in her care.  Lives at home with her son   Right handed   Social Determinants of Health   Financial Resource Strain: Not on file  Food Insecurity: Not on file  Transportation Needs: Not on file  Physical Activity: Not on file  Stress: Not on file  Social Connections: Not on file  Intimate Partner Violence: Not on file   Family History  Problem Relation Age of Onset   Migraines Mother    Aneurysm Father    Congestive Heart Failure Maternal Grandmother    Hypertension Maternal Grandmother    Past Surgical History:  Procedure Laterality Date   CARPAL TUNNEL RELEASE Right 01/2018   IR ANGIO INTRA  EXTRACRAN SEL COM CAROTID INNOMINATE BILAT MOD SED  07/05/2018   IR ANGIO VERTEBRAL SEL VERTEBRAL BILAT MOD SED  07/05/2018   SALPINGOOPHORECTOMY  2006   pt not sure if her ovaries were removed      Mardella Layman, MD 03/28/22 [phone]

## 2022-05-20 ENCOUNTER — Ambulatory Visit (INDEPENDENT_AMBULATORY_CARE_PROVIDER_SITE_OTHER): Payer: Medicaid Other | Admitting: Internal Medicine

## 2022-05-20 ENCOUNTER — Encounter: Payer: Self-pay | Admitting: Internal Medicine

## 2022-05-20 VITALS — BP 140/90 | HR 92 | Ht 61.0 in | Wt 220.0 lb

## 2022-05-20 DIAGNOSIS — F17210 Nicotine dependence, cigarettes, uncomplicated: Secondary | ICD-10-CM | POA: Diagnosis not present

## 2022-05-20 DIAGNOSIS — J4520 Mild intermittent asthma, uncomplicated: Secondary | ICD-10-CM | POA: Diagnosis not present

## 2022-05-20 LAB — POCT EXHALED NITRIC OXIDE: FeNO level (ppb): 13

## 2022-05-20 NOTE — Progress Notes (Signed)
The patient has been prescribed the inhaler advair, albuterol. Inhaler technique was demonstrated to patient. The patient subsequently demonstrated correct technique.  

## 2022-05-20 NOTE — Progress Notes (Signed)
Debra Washington    [id number]    [birth year]  Primary Care Physician:Pcp, No  Referring Physician: Norm Salt, PA [address] [place],  [place] [address] Reason for Consultation: "coughing and choking" Date of Consultation: 05/20/2022  Chief complaint:   Chief Complaint  Patient presents with   Consult    Pt consult, has SOB on exertion w/ dry cough and wheezing.     HPI:  Debra Washington  is a 41 y.o.m woman who presents for new patient evaluation for wheezing and shortness of breath with cough.   Symptoms has been worse over the last 6 months. She has had three episodes of bronchitis since January 2023. Treated with steroids and abx by PCP. Shortness of breath with exertion like going up the stairs. She has albuterol inhaler. Has been on advair inhaler as well for about a month. Taking advair as needed. Takes it about once a day. Doesn't take albuterol inhaler.   Prior to this year Usually she got sick once year.    Unaware of any childhood respiratory disease.   Had some concerns for snoring and OSA back in 2020 but split night study was negative for AHI criteria.  No PAP therapy recommende.d   Social history:  Occupation: she is currently working as a Engineer, structural, Public affairs consultant at [employer].  Exposures: lives at home with her son. No pets.  Smoking history: smokes black and mild cigars - 1 pack a day since she was 16. Never smoked cigarettes.   Social History   Occupational History   Not on file  Tobacco Use   Smoking status: Every Day    Types: Cigars   Smokeless tobacco: Never   Tobacco comments:    3 black and milds a day  Vaping Use   Vaping Use: Never used  Substance and Sexual Activity   Alcohol use: No   Drug use: Not Currently   Sexual activity: Yes    Partners: Male    Birth control/protection: None    Relevant family history:  Family History  Problem Relation Age of Onset   Migraines Mother    Aneurysm Father     Congestive Heart Failure Maternal Grandmother    Hypertension Maternal Grandmother    Heart failure Maternal Grandmother    Asthma Neg Hx     Past Medical History:  Diagnosis Date   Asthma    Hx of ectopic pregnancy 2006   Migraine    Obesity     Past Surgical History:  Procedure Laterality Date   CARPAL TUNNEL RELEASE Right 01/2018   IR ANGIO INTRA EXTRACRAN SEL COM CAROTID INNOMINATE BILAT MOD SED  07/05/2018   IR ANGIO VERTEBRAL SEL VERTEBRAL BILAT MOD SED  07/05/2018   SALPINGOOPHORECTOMY  2006   pt not sure if her ovaries were removed      Physical Exam: Blood pressure 140/90, pulse 92, height 5\' 1"  (1.549 m), weight 220 lb (99.8 kg), SpO2 95 %. Gen:      No acute distress ENT:  no nasal polyps, mucus membranes moist Lungs:    No increased respiratory effort, symmetric chest wall excursion, clear to auscultation bilaterally, no wheezes or crackles CV:         Regular rate and rhythm; no murmurs, rubs, or gallops.  No pedal edema Abd:      + bowel sounds; soft, non-tender; no distension MSK: no acute synovitis of DIP or PIP joints, no mechanics hands.  Skin:      Warm and dry; no rashes Neuro: normal speech, no focal facial asymmetry Psych: alert and oriented x3, normal mood and affect   Data Reviewed/Medical Decision Making:  Independent interpretation of tests: Imaging:  Review of patient's chest xray 2008 images revealed no acute process. The patient's images have been independently reviewed by me.    PFTs: I have personally reviewed the patient's PFTs and spirometry shows FEV1/FVC 71% with low values suggesting restrcition.  FENO 13 ppb     No data to display          Labs:  Lab Results  Component Value Date   WBC 6.2 02/24/2020   HGB 14.3 02/24/2020   HCT 45.8 02/24/2020   MCV 92.3 02/24/2020   PLT 259 02/24/2020   Lab Results  Component Value Date   NA 138 02/24/2020   K 4.4 02/24/2020   CL 104 02/24/2020   CO2 25 02/24/2020      Immunization status:  Immunization History  Administered Date(s) Administered   PFIZER(Purple Top)SARS-COV-2 Vaccination 04/09/2020, 05/04/2020     I reviewed prior external note(s) from hospital, ED visit  I reviewed the result(s) of the labs and imaging as noted above.   I have ordered spirometry and feno  Assessment:  Mild persistent asthma not well controlled Tobacco use disorder  Plan/Recommendations: Did inhaler teaching today and modified her technique. Start taking advair 1 puff twice a day with albuterol as needed.  spirometry shows FEV1/FVC 71% with low values suggesting restrcition.  FENO 13 ppb Needs to quit smoking.   Smoking Cessation Counseling:  1. The patient is an everyday smoker and symptomatic due to the following condition asthma 2. The patient is currently pre-contemplative in quitting smoking. 3. I advised patient to quit smoking. 4. We identified patient specific barriers to change.  5. I personally spent 3  minutes counseling the patient regarding tobacco use disorder. 6. We discussed management of stress and anxiety to help with smoking cessation, when applicable. 7. We discussed nicotine replacement therapy, Wellbutrin, Chantix as possible options. 8. I advised setting a quit date. 9. Follow?up arranged with our office to continue ongoing discussions. 10.Resources given to patient including quit hotline.    We discussed disease management and progression at length today.    Return to Care: Return in about 4 months (around 09/20/2022).  Durel Salts, MD Pulmonary and Critical Care Medicine [facility] [phone]  CC: Norm Salt, [name]

## 2022-05-20 NOTE — Patient Instructions (Addendum)
Please schedule follow up scheduled with myself in 4 months.  If my schedule is not open yet, we will contact you with a reminder closer to that time. Please call [phone] if you haven't heard from [place] a month before.   Take the advair 1 puff in the morning, 1 puff at night. Gargle after use.   Take the albuterol rescue inhaler every 4 to 6 hours as needed for wheezing or shortness of breath. You can also take it 15 minutes before exercise or exertional activity. Side effects include heart racing or pounding, jitters or anxiety. If you have a history of an irregular heart rhythm, it can make this worse. Can also give some patients a hard time sleeping.  I suspect your symptoms are related to asthma.  You need to quit smoking before things get worse!  What are the benefits of quitting smoking? Quitting smoking can lower your chances of getting or dying from heart disease, lung disease, kidney failure, infection, or cancer. It can also lower your chances of getting osteoporosis, a condition that makes your bones weak. Plus, quitting smoking can help your skin look younger and reduce the chances that you will have problems with sex.  Quitting smoking will improve your health no matter how old you are, and no matter how long or how much you have smoked.  What should I do if I want to quit smoking? The letters in the word "START" can help you remember the steps to take: S = Set a quit date. T = Tell family, friends, and the people around you that you plan to quit. A = Anticipate or plan ahead for the tough times you'll face while quitting. R = Remove cigarettes and other tobacco products from your home, car, and work. T = Talk to your doctor about getting help to quit.  How can my doctor or nurse help? Your doctor or nurse can give you advice on the best way to quit. He or she can also put you in touch with counselors or other people you can call for support. Plus, your doctor or nurse can  give you medicines to: ?Reduce your craving for cigarettes ?Reduce the unpleasant symptoms that happen when you stop smoking (called "withdrawal symptoms"). You can also get help from a free phone line (1-800-QUIT-NOW) or go online to [employer].  What are the symptoms of withdrawal? The symptoms include: ?Trouble sleeping ?Being irritable, anxious or restless ?Getting frustrated or angry ?Having trouble thinking clearly  Some people who stop smoking become temporarily depressed. Some people need treatment for depression, such as counseling or antidepressant medicines. Depressed people might: ?No longer enjoy or care about doing the things they used to like to do ?Feel sad, down, hopeless, nervous, or cranky most of the day, almost every day ?Lose or gain weight ?Sleep too much or too little ?Feel tired or like they have no energy ?Feel guilty or like they are worth nothing ?Forget things or feel confused ?Move and speak more slowly than usual ?Act restless or have trouble staying still ?Think about death or suicide  If you think you might be depressed, see your doctor or nurse. Only someone trained in mental health can tell for sure if you are depressed. If you ever feel like you might hurt yourself, go straight to the nearest emergency department. Or you can call for an ambulance (in the [place] and [place], dial 9-1-1) or call your doctor or nurse right away and tell them it  is an emergency. You can also reach the [place] National Suicide Prevention Lifeline at 701-212-9321 or http://hill.com/.  How do medicines help you stop smoking? Different medicines work in different ways: ?Nicotine replacement therapy eases withdrawal and reduces your body's craving for nicotine, the main drug found in cigarettes. There are different forms of nicotine replacement, including skin patches, lozenges, gum, nasal sprays, and "puffers" or inhalers. Many can be bought without a  prescription, while others might require one. ?Bupropion is a prescription medicine that reduces your desire to smoke. This medicine is sold under the brand names Zyban and Wellbutrin. It is also available in a generic version, which is cheaper than brand name medicines. ?Varenicline (brand names: Chantix, Champix) is a prescription medicine that reduces withdrawal symptoms and cigarette cravings. If you think you'd like to take varenicline and you have a history of depression, anxiety, or heart disease, discuss this with your doctor or nurse before taking the medicine. Varenicline can also increase the effects of alcohol in some people. It's a good idea to limit drinking while you're taking it, at least until you know how it affects you.  How does counseling work? Counseling can happen during formal office visits or just over the phone. A counselor can help you: ?Figure out what triggers your smoking and what to do instead ?Overcome cravings ?Figure out what went wrong when you tried to quit before  What works best? Studies show that people have the best luck at quitting if they take medicines to help them quit and work with a Veterinary surgeon. It might also be helpful to combine nicotine replacement with one of the prescription medicines that help people quit. In some cases, it might even make sense to take bupropion and varenicline together.  What about e-cigarettes? Sometimes people wonder if using electronic cigarettes, or "e-cigarettes," might help them quit smoking. Using e-cigarettes is also called "vaping." Doctors do not recommend e-cigarettes in place of medicines and counseling. That's because e-cigarettes still contain nicotine as well as other substances that might be harmful. It's not clear how they can affect a person's health in the long term.  Will I gain weight if I quit? Yes, you might gain a few pounds. But quitting smoking will have a much more positive effect on your health than  weighing a few pounds more. Plus, you can help prevent some weight gain by being more active and eating less. Taking the medicine bupropion might help control weight gain.   What else can I do to improve my chances of quitting? You can: ?Start exercising. ?Stay away from smokers and places that you associate with smoking. If people close to you smoke, ask them to quit with you. ?Keep gum, hard candy, or something to put in your mouth handy. If you get a craving for a cigarette, try one of these instead. ?Don't give up, even if you start smoking again. It takes most people a few tries before they succeed.  What if I am pregnant and I smoke? If you are pregnant, it's really important for the health of your baby that you quit. Ask your doctor what options you have, and what is safest for your baby

## 2022-07-05 ENCOUNTER — Ambulatory Visit (INDEPENDENT_AMBULATORY_CARE_PROVIDER_SITE_OTHER): Payer: Medicaid Other | Admitting: Podiatry

## 2022-07-05 ENCOUNTER — Ambulatory Visit (INDEPENDENT_AMBULATORY_CARE_PROVIDER_SITE_OTHER): Payer: Medicaid Other

## 2022-07-05 DIAGNOSIS — M2141 Flat foot [pes planus] (acquired), right foot: Secondary | ICD-10-CM | POA: Diagnosis not present

## 2022-07-05 DIAGNOSIS — M7751 Other enthesopathy of right foot: Secondary | ICD-10-CM

## 2022-07-05 DIAGNOSIS — M2142 Flat foot [pes planus] (acquired), left foot: Secondary | ICD-10-CM

## 2022-07-05 DIAGNOSIS — M76821 Posterior tibial tendinitis, right leg: Secondary | ICD-10-CM

## 2022-07-05 DIAGNOSIS — M7750 Other enthesopathy of unspecified foot: Secondary | ICD-10-CM

## 2022-07-05 MED ORDER — DICLOFENAC SODIUM 75 MG PO TBEC: 75.0000 mg | DELAYED_RELEASE_TABLET | Freq: Two times a day (BID) | ORAL | 2 refills | Status: AC

## 2022-07-05 NOTE — Patient Instructions (Signed)
These exercises warm up your muscles and joints and improve the movement and flexibility in your ankle and foot. These exercises may also help to relieve pain. Standing wall calf stretch, knee straight   Stand with your hands against a wall. Extend your left / right leg behind you, and bend your front knee slightly. If directed, place a folded washcloth under the arch of your foot for support. Point the toes of your back foot slightly inward. Keeping your heels on the floor and your back knee straight, shift your weight toward the wall. Do not allow your back to arch. You should feel a gentle stretch in your upper left / right calf. Hold this position for 10 seconds. Repeat 10 times. Complete this exercise 2 times a day. Standing wall calf stretch, knee bent Stand with your hands against a wall. Extend your left / right leg behind you, and bend your front knee slightly. If directed, place a folded washcloth under the arch of your foot for support. Point the toes of your back foot slightly inward. Unlock your back knee so it is bent. Keep your heels on the floor. You should feel a gentle stretch deep in your lower left / right calf. Hold this position for 10 seconds. Repeat 10 times. Complete this exercise 2 times a day. Strengthening exercises These exercises build strength and endurance in your ankle and foot. Endurance is the ability to use your muscles for a long time, even after they get tired. Ankle inversion with band Secure one end of a rubber exercise band or tubing to a fixed object, such as a table leg or a pole, that will stay still when the band is pulled. Loop the other end of the band around the middle of your left / right foot. Sit on the floor facing the object with your left / right leg extended. The band or tube should be slightly tense when your foot is relaxed. Leading with your big toe, slowly bring your left / right foot and ankle inward, toward your other foot  (inversion). Hold this position for 10 seconds. Slowly return your foot to the starting position. Repeat 10 times. Complete this exercise 2 times a day. Towel curls   Sit in a chair on a non-carpeted surface, and put your feet on the floor. Place a towel in front of your feet. Keeping your heel on the floor, put your left / right foot on the towel. Pull the towel toward you by grabbing the towel with your toes and curling them under. Keep your heel on the floor while you do this. Let your toes relax. Grab the towel with your toes again. Keep going until the towel is completely underneath your foot. Repeat 10 times. Complete this exercise 2 times a day. Balance exercise This exercise improves or maintains your balance. Balance is important in preventing falls. Single leg stand Without wearing shoes, stand near a railing or in a doorway. You may hold on to the railing or door frame as needed for balance. Stand on your left / right foot. Keep your big toe down on the floor and try to keep your arch lifted. If balancing in this position is too easy, try the exercise with your eyes closed or while standing on a pillow. Hold this position for 10 seconds. Repeat 10 times. Complete this exercise 2 times a day. This information is not intended to replace advice given to you by your health care provider. Make sure you discuss  any questions you have with your health care provider.  

## 2022-07-06 NOTE — Progress Notes (Signed)
  Subjective:  Patient ID: Karl Luke, female    DOB: 06/06/1981,  MRN: [id number]  Chief Complaint  Patient presents with   Foot Pain    New Patient R foot pain w/ swelling    41 y.o. female presents with the above complaint. History confirmed with patient.  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  Pain is along the inside of the arch  Objective:  Physical Exam: warm, good capillary refill, no trophic changes or ulcerative lesions, normal DP and PT pulses, normal sensory exam, and tenderness to palpation on the medial band of the plantar fascia and medial arch, not in the heel, not on the PT tendon at the insertion.   Radiographs: Multiple views x-ray of the right foot: no fracture, dislocation, swelling or degenerative changes noted she has pes planus deformity Assessment:   1. Posterior tibial tendon dysfunction (PTTD) of right lower extremity   2. Pes planus of both feet      Plan:  Patient was evaluated and treated and all questions answered.  Discussed with her treatment options both surgical and nonsurgical of pes planus deformity.  I think this in her weight are contributing factors to this.  I recommended support with a prefabricated OTC insert, she would return if she is interested for the she did not want to purchase this today.  I recommended supportive shoe gears with a good insole, she and her son said that she often wears crocs which I discussed with her give cushioning but do not give much support.  I do not see surgical indications currently or need for further advanced imaging.  Rx for Voltaren 75 mg twice daily as needed sent to pharmacy  Return if symptoms worsen or fail to improve.

## 2022-10-12 ENCOUNTER — Encounter: Payer: Self-pay | Admitting: Podiatry

## 2022-10-12 ENCOUNTER — Ambulatory Visit (INDEPENDENT_AMBULATORY_CARE_PROVIDER_SITE_OTHER): Payer: Medicaid Other | Admitting: Podiatry

## 2022-10-12 ENCOUNTER — Telehealth: Payer: Self-pay

## 2022-10-12 DIAGNOSIS — M76821 Posterior tibial tendinitis, right leg: Secondary | ICD-10-CM | POA: Diagnosis not present

## 2022-10-12 DIAGNOSIS — T148XXA Other injury of unspecified body region, initial encounter: Secondary | ICD-10-CM

## 2022-10-12 NOTE — Progress Notes (Signed)
Subjective:   Patient ID: Debra Washington, female   DOB: 41 y.o.   MRN: [id number]   HPI Patient states she continues to have a lot of pain on the inside of her right ankle and she felt like something gave out yesterday and it has been very hard to walk on   ROS      Objective:  Physical Exam  Neurovascular status intact moderate collapse medial longitudinal arch right inflammation around the posterior tibial tendon as it comes under the medial malleolus right that had been seen by Dr. Lilian Kapur several months     Assessment:  Cannot rule out tear of the posterior tibial tendon due to being creased pain and deformity with flatfoot deformity noted     Plan:  H&P and due to the intensity discomfort inability to bear weight I did dispense air fracture walker and I am sending for MRI of the right ankle to rule out tear and may require repair if it turns out to be torn

## 2022-10-12 NOTE — Telephone Encounter (Signed)
You can send letter

## 2022-10-26 ENCOUNTER — Encounter: Payer: Self-pay | Admitting: Podiatry

## 2022-10-29 ENCOUNTER — Other Ambulatory Visit: Payer: Medicaid Other

## 2023-01-27 ENCOUNTER — Other Ambulatory Visit: Payer: Self-pay | Admitting: Internal Medicine

## 2023-01-27 DIAGNOSIS — Z1231 Encounter for screening mammogram for malignant neoplasm of breast: Secondary | ICD-10-CM

## 2023-02-17 ENCOUNTER — Ambulatory Visit (HOSPITAL_COMMUNITY)
Admission: EM | Admit: 2023-02-17 | Discharge: 2023-02-17 | Disposition: A | Discharge status: Home / Self Care | Payer: Medicaid Other | Attending: Emergency Medicine | Admitting: Emergency Medicine

## 2023-02-17 ENCOUNTER — Encounter (HOSPITAL_COMMUNITY): Payer: Self-pay | Admitting: Emergency Medicine

## 2023-02-17 ENCOUNTER — Encounter (HOSPITAL_COMMUNITY): Payer: Self-pay

## 2023-02-17 ENCOUNTER — Emergency Department (HOSPITAL_COMMUNITY)
Admission: EM | Admit: 2023-02-17 | Discharge: 2023-02-18 | Disposition: A | Discharge status: Home / Self Care | Payer: Medicaid Other | Attending: Emergency Medicine | Admitting: Emergency Medicine

## 2023-02-17 DIAGNOSIS — R52 Pain, unspecified: Secondary | ICD-10-CM | POA: Insufficient documentation

## 2023-02-17 DIAGNOSIS — R519 Headache, unspecified: Secondary | ICD-10-CM | POA: Diagnosis not present

## 2023-02-17 DIAGNOSIS — J029 Acute pharyngitis, unspecified: Secondary | ICD-10-CM | POA: Insufficient documentation

## 2023-02-17 DIAGNOSIS — N210 Calculus in bladder: Secondary | ICD-10-CM | POA: Diagnosis not present

## 2023-02-17 DIAGNOSIS — J111 Influenza due to unidentified influenza virus with other respiratory manifestations: Secondary | ICD-10-CM

## 2023-02-17 DIAGNOSIS — R059 Cough, unspecified: Secondary | ICD-10-CM | POA: Insufficient documentation

## 2023-02-17 DIAGNOSIS — Z1152 Encounter for screening for COVID-19: Secondary | ICD-10-CM | POA: Insufficient documentation

## 2023-02-17 DIAGNOSIS — R531 Weakness: Secondary | ICD-10-CM | POA: Diagnosis not present

## 2023-02-17 DIAGNOSIS — D72829 Elevated white blood cell count, unspecified: Secondary | ICD-10-CM | POA: Insufficient documentation

## 2023-02-17 DIAGNOSIS — N12 Tubulo-interstitial nephritis, not specified as acute or chronic: Secondary | ICD-10-CM | POA: Insufficient documentation

## 2023-02-17 DIAGNOSIS — R509 Fever, unspecified: Secondary | ICD-10-CM | POA: Diagnosis not present

## 2023-02-17 DIAGNOSIS — M545 Low back pain, unspecified: Secondary | ICD-10-CM | POA: Insufficient documentation

## 2023-02-17 DIAGNOSIS — R350 Frequency of micturition: Secondary | ICD-10-CM | POA: Diagnosis present

## 2023-02-17 LAB — CBC WITH DIFFERENTIAL/PLATELET
Abs Immature Granulocytes: 0.05 10*3/uL (ref 0.00–0.07)
Basophils Absolute: 0 10*3/uL (ref 0.0–0.1)
Basophils Relative: 0 %
Eosinophils Absolute: 0 10*3/uL (ref 0.0–0.5)
Eosinophils Relative: 0 %
HCT: 36.3 % (ref 36.0–46.0)
Hemoglobin: 11.9 g/dL — ABNORMAL LOW (ref 12.0–15.0)
Immature Granulocytes: 0 %
Lymphocytes Relative: 11 %
Lymphs Abs: 1.2 10*3/uL (ref 0.7–4.0)
MCH: 27.3 pg (ref 26.0–34.0)
MCHC: 32.8 g/dL (ref 30.0–36.0)
MCV: 83.3 fL (ref 80.0–100.0)
Monocytes Absolute: 1 10*3/uL (ref 0.1–1.0)
Monocytes Relative: 9 %
Neutro Abs: 9 10*3/uL — ABNORMAL HIGH (ref 1.7–7.7)
Neutrophils Relative %: 80 %
Platelets: 117 10*3/uL — ABNORMAL LOW (ref 150–400)
RBC: 4.36 MIL/uL (ref 3.87–5.11)
RDW: 13.2 % (ref 11.5–15.5)
WBC: 11.3 10*3/uL — ABNORMAL HIGH (ref 4.0–10.5)
nRBC: 0 % (ref 0.0–0.2)

## 2023-02-17 LAB — URINALYSIS, ROUTINE W REFLEX MICROSCOPIC
Bacteria, UA: NONE SEEN
Bilirubin Urine: NEGATIVE
Glucose, UA: NEGATIVE mg/dL
Ketones, ur: NEGATIVE mg/dL
Nitrite: NEGATIVE
Protein, ur: 100 mg/dL — AB
Specific Gravity, Urine: 1.021 (ref 1.005–1.030)
pH: 5 (ref 5.0–8.0)

## 2023-02-17 LAB — BASIC METABOLIC PANEL
Anion gap: 10 (ref 5–15)
BUN: 12 mg/dL (ref 6–20)
CO2: 22 mmol/L (ref 22–32)
Calcium: 8.6 mg/dL — ABNORMAL LOW (ref 8.9–10.3)
Chloride: 104 mmol/L (ref 98–111)
Creatinine, Ser: 1.13 mg/dL — ABNORMAL HIGH (ref 0.44–1.00)
GFR, Estimated: >60 mL/min (ref >60)
Glucose, Bld: 126 mg/dL — ABNORMAL HIGH (ref 70–99)
Potassium: 3.6 mmol/L (ref 3.5–5.1)
Sodium: 136 mmol/L (ref 135–145)

## 2023-02-17 LAB — POCT URINALYSIS DIPSTICK, ED / UC
Glucose, UA: NEGATIVE mg/dL
Nitrite: NEGATIVE
Protein, ur: 100 mg/dL — AB
Specific Gravity, Urine: 1.02 (ref 1.005–1.030)
Urobilinogen, UA: 4 mg/dL — ABNORMAL HIGH (ref 0.0–1.0)
pH: 7.5 (ref 5.0–8.0)

## 2023-02-17 LAB — POC INFLUENZA A AND B ANTIGEN (URGENT CARE ONLY)
INFLUENZA A ANTIGEN, POC: NEGATIVE
INFLUENZA B ANTIGEN, POC: NEGATIVE

## 2023-02-17 LAB — RESP PANEL BY RT-PCR (RSV, FLU A&B, COVID)  RVPGX2
Influenza A by PCR: NEGATIVE
Influenza B by PCR: NEGATIVE
Resp Syncytial Virus by PCR: NEGATIVE
SARS Coronavirus 2 by RT PCR: NEGATIVE

## 2023-02-17 MED ORDER — CYCLOBENZAPRINE HCL 10 MG PO TABS: 10.0000 mg | ORAL_TABLET | Freq: Two times a day (BID) | ORAL | 0 refills | Status: DC | PRN

## 2023-02-17 MED ORDER — ACETAMINOPHEN 325 MG PO TABS
ORAL_TABLET | ORAL | Status: AC
  Filled 2023-02-17: qty 2

## 2023-02-17 MED ORDER — ACETAMINOPHEN 325 MG PO TABS
650.0000 mg | ORAL_TABLET | Freq: Once | ORAL | Status: AC
  Administered 2023-02-17: 650 mg via ORAL

## 2023-02-17 MED ORDER — IBUPROFEN 600 MG PO TABS: 600.0000 mg | ORAL_TABLET | Freq: Three times a day (TID) | ORAL | 0 refills | Status: DC

## 2023-02-17 NOTE — ED Triage Notes (Addendum)
body aches, HA, light headed, nausea, fever, and chills onset yesterday. No cough or runny nose. No known sick exposure.  Patient also having urinary frequency but no pain with urination.   No meds tried.

## 2023-02-17 NOTE — ED Provider Notes (Signed)
[facility]    CSN: [id number] Arrival date & time: 02/17/23  0945     History   Chief Complaint Chief Complaint  Patient presents with   Fever   Headache    HPI Debra Washington is a 42 y.o. female.  Last night developed tactile fever, chills, body aches No abd pain or NVD. Denies cough, sore throat, congestion. No known sick contacts Has not taken any medications Reports she does not drink water. Only juice  Urinary frequency today.. No dysuria, urgency, flank pain.  Past Medical History:  Diagnosis Date   Asthma    Hx of ectopic pregnancy 2006   Migraine    Obesity     Patient Active Problem List   Diagnosis Date Noted   Snoring 10/17/2019   Migraine without aura and without status migrainosus, not intractable 05/02/2018   Vaginitis and vulvovaginitis 03/08/2011   Exposure to viral disease 03/08/2011   Low back pain 03/08/2011   Asthma, intermittent 03/08/2011   TOBACCO ABUSE 12/22/2009   COMMON MIGRAINE 12/22/2009    Past Surgical History:  Procedure Laterality Date   CARPAL TUNNEL RELEASE Right 01/2018   IR ANGIO INTRA EXTRACRAN SEL COM CAROTID INNOMINATE BILAT MOD SED  07/05/2018   IR ANGIO VERTEBRAL SEL VERTEBRAL BILAT MOD SED  07/05/2018   SALPINGOOPHORECTOMY  2006   pt not sure if her ovaries were removed     OB History   No obstetric history on file.      Home Medications    Prior to Admission medications   Medication Sig Start Date End Date Taking? Authorizing Provider  cyclobenzaprine (FLEXERIL) 10 MG tablet Take 1 tablet (10 mg total) by mouth 2 (two) times daily as needed for muscle spasms. 02/17/23  Yes Adalaide Jaskolski, Lurena Joiner, PA-C  ibuprofen (ADVIL) 600 MG tablet Take 1 tablet (600 mg total) by mouth 3 (three) times daily. 02/17/23  Yes Toniann Dickerson, Lurena Joiner, PA-C  albuterol (PROVENTIL HFA;VENTOLIN HFA) 108 (90 BASE) MCG/ACT inhaler Inhale 2 puffs into the lungs every 4 (four) hours as needed for wheezing or shortness of breath. For  shortness of breath    [provider]  fluticasone-salmeterol (ADVAIR DISKUS) 250-50 MCG/ACT AEPB Inhale 1 puff into the lungs in the morning and at bedtime.    [provider]  pregabalin (LYRICA) 50 MG capsule 1 cap(s) orally 2 times a day for 30 day(s) 08/30/17   [provider]  SUMAtriptan (IMITREX) 25 MG tablet 1 tab(s) orally once    [provider]    Family History Family History  Problem Relation Age of Onset   Migraines Mother    Aneurysm Father    Congestive Heart Failure Maternal Grandmother    Hypertension Maternal Grandmother    Heart failure Maternal Grandmother    Asthma Neg Hx     Social History Social History   Tobacco Use   Smoking status: Every Day    Types: Cigars   Smokeless tobacco: Never   Tobacco comments:    3 black and milds a day  Vaping Use   Vaping Use: Never used  Substance Use Topics   Alcohol use: No   Drug use: Not Currently     Allergies   Penicillins   Review of Systems Review of Systems As per HPI  Physical Exam Triage Vital Signs ED Triage Vitals  Enc Vitals Group     BP 02/17/23 1049 131/79     Pulse Rate 02/17/23 1049 (!) 114  Resp 02/17/23 1049 20     Temp 02/17/23 1049 (!) 101.1 F (38.4 C)     Temp Source 02/17/23 1049 Oral     SpO2 02/17/23 1049 93 %     Weight 02/17/23 1049 209 lb (94.8 kg)     Height 02/17/23 1049  (1.549 m)     Head Circumference --      Peak Flow --      Pain Score 02/17/23 1048 8     Pain Loc --      Pain Edu? --      Excl. in GC? --    No data found.  Updated Vital Signs BP 131/79 (BP Location: Left Arm)   Pulse (!) 105   Temp 99.3 F (37.4 C) (Oral)   Resp 20   Ht  (1.549 m)   Wt 209 lb (94.8 kg)   LMP 02/06/2023 (Approximate)   SpO2 93%   BMI 39.49 kg/m   Physical Exam Vitals and nursing note reviewed.  Constitutional:      General: She is not in acute distress.    Appearance: She is ill-appearing.  HENT:     Right  Ear: Tympanic membrane and ear canal normal.     Left Ear: Tympanic membrane and ear canal normal.     Nose: No congestion or rhinorrhea.     Mouth/Throat:     Mouth: Mucous membranes are moist.     Pharynx: Oropharynx is clear. No posterior oropharyngeal erythema.  Eyes:     Conjunctiva/sclera: Conjunctivae normal.  Cardiovascular:     Rate and Rhythm: Normal rate and regular rhythm.     Pulses: Normal pulses.     Heart sounds: Normal heart sounds.  Pulmonary:     Effort: Pulmonary effort is normal.     Breath sounds: Normal breath sounds.  Abdominal:     General: Bowel sounds are normal.     Palpations: Abdomen is soft.     Tenderness: There is no abdominal tenderness. There is no guarding or rebound.  Musculoskeletal:     Cervical back: Normal range of motion.  Lymphadenopathy:     Cervical: No cervical adenopathy.  Skin:    General: Skin is warm and dry.  Neurological:     Mental Status: She is alert and oriented to person, place, and time.      UC Treatments / Results  Labs (all labs ordered are listed, but only abnormal results are displayed) Labs Reviewed  POCT URINALYSIS DIPSTICK, ED / UC - Abnormal; Notable for the following components:      Result Value   Bilirubin Urine SMALL (*)    Ketones, ur TRACE (*)    Hgb urine dipstick MODERATE (*)    Protein, ur 100 (*)    Urobilinogen, UA 4.0 (*)    Leukocytes,Ua SMALL (*)    All other components within normal limits  URINE CULTURE  POC INFLUENZA A AND B ANTIGEN (URGENT CARE ONLY)    EKG   Radiology No results found.  Procedures Procedures (including critical care time)  Medications Ordered in UC Medications  acetaminophen (TYLENOL) tablet 650 mg (650 mg Oral Given 02/17/23 1109)    Initial Impression / Assessment and Plan / UC Course  I have reviewed the triage vital signs and the nursing notes.  Pertinent labs & imaging results that were available during my care of the patient were reviewed by me  and considered in my medical decision making (see chart for  details).  Temp 101 on arrival. Tylenol dose given. Rapid flu A/B negative.  Recommend symptomatic care. Tylenol, ibu, flexaril BID prn. Discussed increased water intake as much as possible.   UA with moderate hgb, small leuks. Will culture and treat positive if indicated.   Work note provided  Return precautions discussed. Patient agrees to plan  Final Clinical Impressions(s) / UC Diagnoses   Final diagnoses:  Influenza-like illness     Discharge Instructions      Alternate tylenol and ibuprofen every 6 hours for pain. You can take the muscle relaxer twice daily which may help with body aches. If the medication makes you drowsy, take only at bed time.  Try to drink lots of water. You should have at least 64 oz daily. This is very important to help the body heal and keep you hydrated.      ED Prescriptions     Medication Sig Dispense Auth. Provider   ibuprofen (ADVIL) 600 MG tablet Take 1 tablet (600 mg total) by mouth 3 (three) times daily. 21 tablet Flynt Breeze, PA-C   cyclobenzaprine (FLEXERIL) 10 MG tablet Take 1 tablet (10 mg total) by mouth 2 (two) times daily as needed for muscle spasms. 15 tablet Christal Lagerstrom, Lurena Joiner, PA-C      PDMP not reviewed this encounter.   Nithila Sumners, Ray Church 02/17/23 1328

## 2023-02-17 NOTE — ED Provider Triage Note (Signed)
Emergency Medicine Provider Triage Evaluation Note  Debra Washington , a 42 y.o. female  was evaluated in triage.  Patient presenting today with concern for urinary frequency, nausea, body aches.  Was seen at urgent care and given muscle relaxants and ibuprofen which she has not yet tried.  Review of Systems  Positive:  Negative:   Physical Exam  BP 123/73 (BP Location: Right Arm)   Pulse 100   Temp 98 F (36.7 C) (Oral)   Resp 18   LMP 02/06/2023 (Approximate)   SpO2 98%  Gen:   Awake, no distress   Resp:  Normal effort  MSK:   Moves extremities without difficulty  Other:    Medical Decision Making  Medically screening exam initiated at 10:01 PM.  Appropriate orders placed.  Lulamae Renshaw was informed that the remainder of the evaluation will be completed by another provider, this initial triage assessment does not replace that evaluation, and the importance of remaining in the ED until their evaluation is complete.     Saddie Benders, PA-C 02/17/23 2202

## 2023-02-17 NOTE — Discharge Instructions (Addendum)
Alternate tylenol and ibuprofen every 6 hours for pain. You can take the muscle relaxer twice daily which may help with body aches. If the medication makes you drowsy, take only at bed time.  Try to drink lots of water. You should have at least 64 oz daily. This is very important to help the body heal and keep you hydrated.

## 2023-02-18 ENCOUNTER — Emergency Department (HOSPITAL_COMMUNITY): Payer: Medicaid Other

## 2023-02-18 LAB — PREGNANCY, URINE: Preg Test, Ur: NEGATIVE

## 2023-02-18 LAB — LACTIC ACID, PLASMA: Lactic Acid, Venous: 1.3 mmol/L (ref 0.5–1.9)

## 2023-02-18 MED ORDER — CEPHALEXIN 500 MG PO CAPS: 500.0000 mg | ORAL_CAPSULE | Freq: Three times a day (TID) | ORAL | 0 refills | Status: AC

## 2023-02-18 MED ORDER — ACETAMINOPHEN 325 MG PO TABS: 650.0000 mg | ORAL_TABLET | Freq: Once | ORAL | Status: DC

## 2023-02-18 MED ORDER — LACTATED RINGERS IV BOLUS
1000.0000 mL | Freq: Once | INTRAVENOUS | Status: AC
  Administered 2023-02-18: 1000 mL via INTRAVENOUS

## 2023-02-18 MED ORDER — ONDANSETRON HCL 4 MG PO TABS: 4.0000 mg | ORAL_TABLET | Freq: Four times a day (QID) | ORAL | 0 refills | Status: DC

## 2023-02-18 MED ORDER — SODIUM CHLORIDE 0.9 % IV SOLN
1.0000 g | Freq: Once | INTRAVENOUS | Status: AC
  Administered 2023-02-18: 1 g via INTRAVENOUS
  Filled 2023-02-18: qty 10

## 2023-02-18 NOTE — Discharge Instructions (Signed)
We suspect you have a kidney infection called pyelonephritis.  Take the antibiotics as prescribed and follow-up with your primary doctor for recheck.  It appears you have passed a kidney stone into your bladder so the worst of your pain should be over.  Take the pain and nausea medications as prescribed.  Follow-up with your doctor.  You will be called if your urine culture grows something and needs a change in your antibiotics.  Return to the ED with worsening pain, fever, not able to eat or drink, high fever, not able to urinate or other concerns.

## 2023-02-18 NOTE — ED Notes (Signed)
Patient transported to CT scan . 

## 2023-02-18 NOTE — ED Provider Notes (Signed)
[place] [facility] AT [facility] Provider Note   CSN: [id number] Arrival date & time: 02/17/23  2041     History  No chief complaint on file.   Debra Washington is a 42 y.o. female.  Patient presents with a 2-day history of body aches, chills, fever up to 101, urinary frequency, urgency, dysuria.  Also has a headache, chills, cough, runny nose.  She was seen at urgent care earlier today had a negative flu swab was not given a clear diagnosis.  Comes in today with worsening body aches, chills and left flank pain.  Feels sore and achy all over.  No significant headache currently.  No shortness of breath.  No chest pain.  No abdominal pain.  Nausea but no vomiting.  Does have urinary frequency, urgency and hesitancy with dysuria.  No hematuria No travel or sick contacts.  No significant sore throat.  Having pain to her left flank that does not radiate down her leg.  No focal weakness, numbness or tingling.  The history is provided by the patient.       Home Medications Prior to Admission medications   Medication Sig Start Date End Date Taking? Authorizing Provider  albuterol (PROVENTIL HFA;VENTOLIN HFA) 108 (90 BASE) MCG/ACT inhaler Inhale 2 puffs into the lungs every 4 (four) hours as needed for wheezing or shortness of breath. For shortness of breath    [provider]  cyclobenzaprine (FLEXERIL) 10 MG tablet Take 1 tablet (10 mg total) by mouth 2 (two) times daily as needed for muscle spasms. 02/17/23   Rising, Lurena Joiner, PA-C  fluticasone-salmeterol (ADVAIR DISKUS) 250-50 MCG/ACT AEPB Inhale 1 puff into the lungs in the morning and at bedtime.    [provider]  ibuprofen (ADVIL) 600 MG tablet Take 1 tablet (600 mg total) by mouth 3 (three) times daily. 02/17/23   Rising, Lurena Joiner, PA-C  pregabalin (LYRICA) 50 MG capsule 1 cap(s) orally 2 times a day for 30 day(s) 08/30/17   [provider]  SUMAtriptan (IMITREX) 25 MG tablet 1 tab(s)  orally once    [provider]      Allergies    Penicillins    Review of Systems   Review of Systems  Constitutional:  Positive for activity change, appetite change, chills, fatigue and fever.  HENT:  Positive for congestion and sore throat.   Respiratory:  Positive for cough.   Cardiovascular:  Negative for chest pain.  Gastrointestinal:  Positive for abdominal pain and nausea. Negative for vomiting.  Genitourinary:  Positive for dysuria, frequency and urgency. Negative for hematuria.  Musculoskeletal:  Positive for arthralgias and myalgias.  Skin:  Negative for rash.  Neurological:  Positive for weakness and headaches.   all other systems are negative except as noted in the HPI and PMH.    Physical Exam Updated Vital Signs BP (!) 99/59 (BP Location: Right Arm)   Pulse 87   Temp 98 F (36.7 C)   Resp 19   LMP 02/06/2023 (Approximate)   SpO2 96%  Physical Exam Vitals and nursing note reviewed.  Constitutional:      General: She is not in acute distress.    Appearance: She is well-developed.     Comments: Shaking chills, rigors  HENT:     Head: Normocephalic and atraumatic.     Right Ear: Tympanic membrane normal.     Left Ear: Tympanic membrane normal.     Mouth/Throat:     Mouth: Mucous membranes are moist.  Pharynx: No oropharyngeal exudate or posterior oropharyngeal erythema.  Eyes:     Conjunctiva/sclera: Conjunctivae normal.     Pupils: Pupils are equal, round, and reactive to light.  Neck:     Comments: No meningismus. Cardiovascular:     Rate and Rhythm: Normal rate and regular rhythm.     Heart sounds: Normal heart sounds. No murmur heard. Pulmonary:     Effort: Pulmonary effort is normal. No respiratory distress.     Breath sounds: Normal breath sounds.  Abdominal:     Palpations: Abdomen is soft.     Tenderness: There is no abdominal tenderness. There is no guarding or rebound.  Musculoskeletal:        General: Tenderness present. Normal  range of motion.     Cervical back: Normal range of motion and neck supple.     Comments: Left paraspinal lumbar tenderness  Skin:    General: Skin is warm.  Neurological:     General: No focal deficit present.     Mental Status: She is alert and oriented to person, place, and time.     Cranial Nerves: No cranial nerve deficit.     Motor: No abnormal muscle tone.     Coordination: Coordination normal.     Comments:  5/5 strength throughout. CN 2-12 intact.Equal grip strength.   Psychiatric:        Behavior: Behavior normal.     ED Results / Procedures / Treatments   Labs (all labs ordered are listed, but only abnormal results are displayed) Labs Reviewed  CBC WITH DIFFERENTIAL/PLATELET - Abnormal; Notable for the following components:      Result Value   WBC 11.3 (*)    Hemoglobin 11.9 (*)    Platelets 117 (*)    Neutro Abs 9.0 (*)    All other components within normal limits  BASIC METABOLIC PANEL - Abnormal; Notable for the following components:   Glucose, Bld 126 (*)    Creatinine, Ser 1.13 (*)    Calcium 8.6 (*)    All other components within normal limits  URINALYSIS, ROUTINE W REFLEX MICROSCOPIC - Abnormal; Notable for the following components:   Color, Urine AMBER (*)    APPearance HAZY (*)    Hgb urine dipstick MODERATE (*)    Protein, ur 100 (*)    Leukocytes,Ua MODERATE (*)    All other components within normal limits  RESP PANEL BY RT-PCR (RSV, FLU A&B, COVID)  RVPGX2  CULTURE, BLOOD (ROUTINE X 2)  CULTURE, BLOOD (ROUTINE X 2)  LACTIC ACID, PLASMA  PREGNANCY, URINE    EKG EKG Interpretation  Date/Time:  Saturday February 18 2023 04:12:43 EDT Ventricular Rate:  95 PR Interval:  166 QRS Duration: 83 QT Interval:  336 QTC Calculation: 423 R Axis:   62 Text Interpretation: Sinus rhythm RSR' in V1 or V2, right VCD or RVH Consider left ventricular hypertrophy No previous ECGs available Confirmed by Glynn Octave [phone]) on 02/18/2023 4:22:32  AM  Radiology CT Renal Stone Study  Result Date: 02/18/2023 CLINICAL DATA:  Left-sided flank pain.  Nausea since yesterday. EXAM: CT ABDOMEN AND PELVIS WITHOUT CONTRAST TECHNIQUE: Multidetector CT imaging of the abdomen and pelvis was performed following the standard protocol without IV contrast. RADIATION DOSE REDUCTION: This exam was performed according to the departmental dose-optimization program which includes automated exposure control, adjustment of the mA and/or kV according to patient size and/or use of iterative reconstruction technique. COMPARISON:  None Available. FINDINGS: Lower chest: Lung bases are clear.  Note that the lack of IV contrast limits the ability to assess the abdominal and pelvic solid organs. Hepatobiliary: Liver has a normal contour. No perihepatic fluid. No focal liver lesions. Gallbladder is normal in appearance without evidence of cholelithiasis. Pancreas: No evidence of peripancreatic fat stranding to suggest pancreatitis. Spleen: Normal in size without focal abnormality. Adrenals/Urinary Tract: Bilateral adrenal glands are normal in appearance. No evidence of hydronephrosis in either kidney. Bilateral kidneys are symmetric in size. No significant perinephric stranding is visualized. There is a 3 mm nonobstructing renal stone at the upper pole of the right kidney (series 6, image 52). There are likely a few punctate renal stones at the lower pole of the left kidney which are difficult measure (series 6, image 55/series 3, image 20). There is a small layering stone at the posterior aspect of the bladder measuring 2 mm (series 3, image 61). Stomach/Bowel: Stomach is within normal limits. Appendix appears normal. No evidence of bowel wall thickening, distention, or inflammatory changes. Vascular/Lymphatic: No significant vascular findings are present. No enlarged abdominal or pelvic lymph nodes. Reproductive: Uterus and bilateral adnexa are unremarkable. Other: No abdominal wall  hernia or abnormality. No abdominopelvic ascites. Musculoskeletal: No acute or significant osseous findings. IMPRESSION: 1. There is a 2 mm layering stone at the posterior aspect of the bladder. No evidence of hydronephrosis in either kidney. 2. Additional small nonobstructing bilateral renal stones, as above. Electronically Signed   By: Lorenza Cambridge M.D.   On: 02/18/2023 06:31   DG Chest 2 View  Result Date: 02/18/2023 CLINICAL DATA:  Fevers. EXAM: CHEST - 2 VIEW COMPARISON:  10/24/2007 FINDINGS: The heart size and mediastinal contours are within normal limits. Central airway thickening. Both lungs are clear. The visualized skeletal structures are unremarkable. IMPRESSION: 1. No airspace consolidation. 2. Central airway thickening compatible with bronchitis. Electronically Signed   By: Signa Kell M.D.   On: 02/18/2023 05:04    Procedures Procedures    Medications Ordered in ED Medications  lactated ringers bolus 1,000 mL (has no administration in time range)  cefTRIAXone (ROCEPHIN) 1 g in sodium chloride 0.9 % 100 mL IVPB (has no administration in time range)    ED Course/ Medical Decision Making/ A&P                             Medical Decision Making Amount and/or Complexity of Data Reviewed Labs: ordered. Decision-making details documented in ED Course. Radiology: ordered and independent interpretation performed. Decision-making details documented in ED Course. ECG/medicine tests: ordered and independent interpretation performed. Decision-making details documented in ED Course.  Risk Prescription drug management.   Myalgia, chills, fever, urinary symptoms.  Patient with shaking chills and rigors but nontoxic-appearing.  Will give IV fluids after cultures are obtained initiate IV antibiotics.  Urine is suspicious for infection with many red cells and many white cells.  Will send for culture.  Workup shows possible urinalysis with infection.  Culture sent.  Patient given IV  fluids as well as IV Rocephin.  Chest x-ray is negative for infiltrate but does show bronchitic changes.  Results reviewed interpreted by me.  Minimal leukocytosis.  Lactate is normal.  Suspect likely pyelonephritis causing patient's symptoms.  Will obtain CT scan to rule out obstructing kidney stone.  CT can shows 2 mm stone in the bladder without hydronephrosis.  Discussed with Dr. Ronne Binning of urology.  He agrees patient can be treated as pyelonephritis without need for any kind  of stent at this time as stone is in the bladder.  Patient feels improved on recheck.  She is tolerating p.o.  His pain was controlled.  Her rigors have improved.  Will treat for pyelonephritis with antibiotics while cultures are pending.  Follow-up with PCP.  Return to the ED sooner with worsening pain, weakness, numbness, tingling, fever, unable to urinate or other concerns.       Final Clinical Impression(s) / ED Diagnoses Final diagnoses:  Pyelonephritis    Rx / DC Orders ED Discharge Orders     None         Navi Ewton, Jeannett Senior, MD 02/18/23 [phone]

## 2023-02-19 LAB — URINE CULTURE: Culture: 10000 — AB

## 2023-02-20 ENCOUNTER — Telehealth (HOSPITAL_COMMUNITY): Payer: Self-pay | Admitting: Emergency Medicine

## 2023-02-20 NOTE — Telephone Encounter (Signed)
Opened in error

## 2023-02-23 LAB — CULTURE, BLOOD (ROUTINE X 2)
Culture: NO GROWTH
Special Requests: ADEQUATE

## 2023-03-13 ENCOUNTER — Emergency Department (HOSPITAL_COMMUNITY)
Admission: EM | Admit: 2023-03-13 | Discharge: 2023-03-13 | Disposition: A | Discharge status: Home / Self Care | Payer: Commercial Managed Care - HMO | Attending: Emergency Medicine | Admitting: Emergency Medicine

## 2023-03-13 ENCOUNTER — Other Ambulatory Visit: Payer: Self-pay

## 2023-03-13 ENCOUNTER — Emergency Department (HOSPITAL_BASED_OUTPATIENT_CLINIC_OR_DEPARTMENT_OTHER): Payer: Commercial Managed Care - HMO

## 2023-03-13 ENCOUNTER — Encounter (HOSPITAL_COMMUNITY): Payer: Self-pay

## 2023-03-13 ENCOUNTER — Emergency Department (HOSPITAL_COMMUNITY): Payer: Commercial Managed Care - HMO

## 2023-03-13 DIAGNOSIS — F17210 Nicotine dependence, cigarettes, uncomplicated: Secondary | ICD-10-CM | POA: Insufficient documentation

## 2023-03-13 DIAGNOSIS — M7989 Other specified soft tissue disorders: Secondary | ICD-10-CM | POA: Diagnosis present

## 2023-03-13 DIAGNOSIS — R600 Localized edema: Secondary | ICD-10-CM | POA: Diagnosis not present

## 2023-03-13 HISTORY — DX: Calculus of kidney: N20.0

## 2023-03-13 LAB — CBC WITH DIFFERENTIAL/PLATELET
Abs Immature Granulocytes: 0.01 10*3/uL (ref 0.00–0.07)
Basophils Absolute: 0 10*3/uL (ref 0.0–0.1)
Basophils Relative: 1 %
Eosinophils Absolute: 0.3 10*3/uL (ref 0.0–0.5)
Eosinophils Relative: 5 %
HCT: 33.6 % — ABNORMAL LOW (ref 36.0–46.0)
Hemoglobin: 10.5 g/dL — ABNORMAL LOW (ref 12.0–15.0)
Immature Granulocytes: 0 %
Lymphocytes Relative: 34 %
Lymphs Abs: 1.7 10*3/uL (ref 0.7–4.0)
MCH: 26.9 pg (ref 26.0–34.0)
MCHC: 31.3 g/dL (ref 30.0–36.0)
MCV: 85.9 fL (ref 80.0–100.0)
Monocytes Absolute: 0.4 10*3/uL (ref 0.1–1.0)
Monocytes Relative: 9 %
Neutro Abs: 2.6 10*3/uL (ref 1.7–7.7)
Neutrophils Relative %: 51 %
Platelets: 202 10*3/uL (ref 150–400)
RBC: 3.91 MIL/uL (ref 3.87–5.11)
RDW: 14.2 % (ref 11.5–15.5)
WBC: 5 10*3/uL (ref 4.0–10.5)
nRBC: 0 % (ref 0.0–0.2)

## 2023-03-13 LAB — COMPREHENSIVE METABOLIC PANEL
ALT: 20 U/L (ref 0–44)
AST: 19 U/L (ref 15–41)
Albumin: 3.2 g/dL — ABNORMAL LOW (ref 3.5–5.0)
Alkaline Phosphatase: 48 U/L (ref 38–126)
Anion gap: 5 (ref 5–15)
BUN: 15 mg/dL (ref 6–20)
CO2: 25 mmol/L (ref 22–32)
Calcium: 8.8 mg/dL — ABNORMAL LOW (ref 8.9–10.3)
Chloride: 110 mmol/L (ref 98–111)
Creatinine, Ser: 0.85 mg/dL (ref 0.44–1.00)
GFR, Estimated: >60 mL/min (ref >60)
Glucose, Bld: 89 mg/dL (ref 70–99)
Potassium: 3.9 mmol/L (ref 3.5–5.1)
Sodium: 140 mmol/L (ref 135–145)
Total Bilirubin: 0.8 mg/dL (ref 0.3–1.2)
Total Protein: 6.6 g/dL (ref 6.5–8.1)

## 2023-03-13 LAB — BRAIN NATRIURETIC PEPTIDE: B Natriuretic Peptide: 26.1 pg/mL (ref 0.0–100.0)

## 2023-03-13 MED ORDER — FUROSEMIDE 40 MG PO TABS
20.0000 mg | ORAL_TABLET | Freq: Once | ORAL | Status: AC
  Administered 2023-03-13: 20 mg via ORAL
  Filled 2023-03-13: qty 1

## 2023-03-13 MED ORDER — FUROSEMIDE 20 MG PO TABS: 20.0000 mg | ORAL_TABLET | Freq: Every day | ORAL | 0 refills | Status: DC

## 2023-03-13 MED ORDER — ACETAMINOPHEN 500 MG PO TABS
1000.0000 mg | ORAL_TABLET | Freq: Once | ORAL | Status: AC
  Administered 2023-03-13: 1000 mg via ORAL
  Filled 2023-03-13: qty 2

## 2023-03-13 NOTE — ED Provider Notes (Signed)
[place] EMERGENCY DEPARTMENT AT [facility] Provider Note   CSN: [id number] Arrival date & time: 03/13/23  1048    History  Chief Complaint  Patient presents with   Foot Swelling    Debra Washington is a 42 y.o. female.  HPI Presents with concern of worsening pain in both feet, right medial ankle. She is here with female companion who assists with history.  Patient works in housekeeping, is active daily.  She is in podiatry at least once, was recommended for outpatient MRI, but that did not occur. She notes that over the past few weeks she has had increasing pain and swelling bilaterally, with pain and swelling extending into the lower legs.  No chest pain, dyspnea, fever, chills.  She notes occasional alcohol use, daily cigarette use.  She does have a history of trauma to the right ankle in the distant past.    Home Medications Prior to Admission medications   Medication Sig Start Date End Date Taking? Authorizing Provider  furosemide (LASIX) 20 MG tablet Take 1 tablet (20 mg total) by mouth daily. 03/13/23  Yes Gerhard Munch, MD  albuterol (PROVENTIL HFA;VENTOLIN HFA) 108 (90 BASE) MCG/ACT inhaler Inhale 2 puffs into the lungs every 4 (four) hours as needed for wheezing or shortness of breath. For shortness of breath    [provider]  cyclobenzaprine (FLEXERIL) 10 MG tablet Take 1 tablet (10 mg total) by mouth 2 (two) times daily as needed for muscle spasms. 02/17/23   Rising, Lurena Joiner, PA-C  fluticasone-salmeterol (ADVAIR DISKUS) 250-50 MCG/ACT AEPB Inhale 1 puff into the lungs in the morning and at bedtime.    [provider]  ibuprofen (ADVIL) 600 MG tablet Take 1 tablet (600 mg total) by mouth 3 (three) times daily. 02/17/23   Rising, Rebecca, PA-C  ondansetron (ZOFRAN) 4 MG tablet Take 1 tablet (4 mg total) by mouth every 6 (six) hours. 02/18/23   Rancour, Jeannett Senior, MD  pregabalin (LYRICA) 50 MG capsule 1 cap(s) orally 2 times a day for 30 day(s)  08/30/17   [provider]  SUMAtriptan (IMITREX) 25 MG tablet 1 tab(s) orally once    [provider]      Allergies    Penicillins    Review of Systems   Review of Systems  All other systems reviewed and are negative.   Physical Exam Updated Vital Signs BP 132/83 (BP Location: Left Arm)   Pulse 91   Temp 97.8 F (36.6 C) (Oral)   Resp 16   Ht 5\' 1"  (1.549 m)   Wt 89.8 kg   LMP 03/13/2023 (Approximate)   SpO2 100%   BMI 37.41 kg/m  Physical Exam Vitals and nursing note reviewed.  Constitutional:      General: She is not in acute distress.    Appearance: She is well-developed.  HENT:     Head: Normocephalic and atraumatic.  Eyes:     Conjunctiva/sclera: Conjunctivae normal.  Cardiovascular:     Rate and Rhythm: Normal rate and regular rhythm.  Pulmonary:     Effort: Pulmonary effort is normal. No respiratory distress.     Breath sounds: Normal breath sounds. No stridor.  Abdominal:     General: There is no distension.  Musculoskeletal:     Right lower leg: Edema present.     Left lower leg: Edema present.       Legs:  Skin:    General: Skin is warm and dry.  Neurological:     Mental  Status: She is alert and oriented to person, place, and time.     Cranial Nerves: No cranial nerve deficit.  Psychiatric:        Mood and Affect: Mood normal.     ED Results / Procedures / Treatments   Labs (all labs ordered are listed, but only abnormal results are displayed) Labs Reviewed  COMPREHENSIVE METABOLIC PANEL - Abnormal; Notable for the following components:      Result Value   Calcium 8.8 (*)    Albumin 3.2 (*)    All other components within normal limits  CBC WITH DIFFERENTIAL/PLATELET - Abnormal; Notable for the following components:   Hemoglobin 10.5 (*)    HCT 33.6 (*)    All other components within normal limits  BRAIN NATRIURETIC PEPTIDE    EKG None  Radiology VAS [place] LOWER EXTREMITY VENOUS (DVT) (7a-7p)  Result Date:  03/13/2023  Lower Venous DVT Study Patient Name:  CHRISHANNA Washington  Date of Exam:   03/13/2023 Medical Rec #: [id number]        Accession #:    [id number] Date of Birth: 04/21/1981         Patient Gender: F Patient Age:   71 years Exam Location:  [facility] Procedure:      VAS [place] LOWER EXTREMITY VENOUS (DVT) Referring Phys: Molly Maduro Shinita Mac --------------------------------------------------------------------------------  Indications: Swelling.  Limitations: Pain intolerance. Comparison Study: previous exam on 02/24/20 was negative for DVT Performing Technologist: Ernestene Mention RVT, RDMS  Examination Guidelines: A complete evaluation includes B-mode imaging, spectral Doppler, color Doppler, and power Doppler as needed of all accessible portions of each vessel. Bilateral testing is considered an integral part of a complete examination. Limited examinations for reoccurring indications may be performed as noted. The reflux portion of the exam is performed with the patient in reverse Trendelenburg.  +---------+---------------+---------+-----------+----------+--------------+ RIGHT    CompressibilityPhasicitySpontaneityPropertiesThrombus Aging +---------+---------------+---------+-----------+----------+--------------+ CFV      Full           Yes      Yes                                 +---------+---------------+---------+-----------+----------+--------------+ SFJ      Full                                                        +---------+---------------+---------+-----------+----------+--------------+ FV Prox  Full           Yes      Yes                                 +---------+---------------+---------+-----------+----------+--------------+ FV Mid   Full           Yes      Yes                                 +---------+---------------+---------+-----------+----------+--------------+ FV DistalFull           Yes      Yes                                  +---------+---------------+---------+-----------+----------+--------------+  PFV      Full                                                        +---------+---------------+---------+-----------+----------+--------------+ POP      Full           Yes      Yes                                 +---------+---------------+---------+-----------+----------+--------------+ PTV      Full                                                        +---------+---------------+---------+-----------+----------+--------------+ PERO     Full                                                        +---------+---------------+---------+-----------+----------+--------------+   +----+---------------+---------+-----------+----------+--------------+ LEFTCompressibilityPhasicitySpontaneityPropertiesThrombus Aging +----+---------------+---------+-----------+----------+--------------+ CFV Full           Yes      Yes                                 +----+---------------+---------+-----------+----------+--------------+     Summary: RIGHT: - There is no evidence of deep vein thrombosis in the lower extremity.  - No cystic structure found in the popliteal fossa. subcutaneous edema in area of calf and ankle  LEFT: - No evidence of common femoral vein obstruction.  *See table(s) above for measurements and observations.    Preliminary    DG Foot Complete Right  Result Date: 03/13/2023 CLINICAL DATA:  Right foot pain. EXAM: RIGHT FOOT COMPLETE - 3+ VIEW COMPARISON:  None Available. FINDINGS: There is no evidence of fracture or dislocation. There is no evidence of arthropathy or other focal bone abnormality. Soft tissues swelling about the dorsum of the foot. IMPRESSION: Soft tissue swelling about the dorsum of the foot. No acute fracture or dislocation. Electronically Signed   By: Larose Hires D.O.   On: 03/13/2023 12:12   DG Foot Complete Left  Result Date: 03/13/2023 CLINICAL DATA:  Left foot pain. EXAM:  LEFT FOOT - COMPLETE 3+ VIEW COMPARISON:  None Available. FINDINGS: There is no evidence of fracture or dislocation. There is no evidence of arthropathy or other focal bone abnormality. Soft tissues are swelling about the dorsum of the foot. IMPRESSION: Soft tissue swelling about the dorsum of the foot. No fracture or dislocation. Electronically Signed   By: Larose Hires D.O.   On: 03/13/2023 12:12   DG Ankle 2 Views Right  Result Date: 03/13/2023 CLINICAL DATA:  Right ankle pain EXAM: RIGHT ANKLE - 2 VIEW COMPARISON:  None Available. FINDINGS: There is no evidence of fracture, dislocation, or joint effusion. There is no evidence of arthropathy. Small plantar calcaneal spurring. Soft tissue swelling about the ankle. IMPRESSION: Soft tissue swelling about the ankle. No acute fracture or dislocation.  Electronically Signed   By: Larose Hires D.O.   On: 03/13/2023 12:11    Procedures Procedures    Medications Ordered in ED Medications  furosemide (LASIX) tablet 20 mg (has no administration in time range)  acetaminophen (TYLENOL) tablet 1,000 mg (has no administration in time range)    ED Course/ Medical Decision Making/ A&P                             Medical Decision Making Adult female presents with worsening pain and edema bilateral lower extremities. Pain is likely secondary to inflammation and chronic use, there was some consideration of fracture versus posttrauma pain in the right ankle x-rays were performed.  Additional systemic concerns including hepatobiliary dysfunction, heart failure considered.  Labs sent.  Amount and/or Complexity of Data Reviewed Independent Historian: friend External Data Reviewed: notes.    Details: Notes from last month with CT suggesting kidney stones reviewed Labs: ordered. Decision-making details documented in ED Course. Radiology: ordered and independent interpretation performed. Decision-making details documented in ED Course.  Risk OTC  drugs. Prescription drug management. Decision regarding hospitalization.     3:35 PM Patient awake, alert, in no distress, speaking clearly.  I discussed her ultrasound with the sonographer, no DVT, reviewed her x-rays, no fracture, labs unremarkable, no evidence for congestive heart failure.  We discussed her situation which is challenging, as she is living in her car, sleeping and essentially upright position, and working as a Advertising copywriter. Patient will have Lasix started.  She will follow-up with primary care.  She notes that she is already working with social services for housing assistance.         Final Clinical Impression(s) / ED Diagnoses Final diagnoses:  Peripheral edema    Rx / DC Orders ED Discharge Orders          Ordered    furosemide (LASIX) 20 MG tablet  Daily        03/13/23 1535              Gerhard Munch, MD 03/13/23 1538

## 2023-03-13 NOTE — ED Triage Notes (Addendum)
Pt c/o bilateral foot swelling x6 months.  Pt reports being seen by a "Foot doctor" for same w/o a diagnosis.  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   Pt reports she is currently homeless and unable to elevate feet.

## 2023-03-13 NOTE — Progress Notes (Signed)
RLE venous duplex has been completed.  Prelimianry results given to Dr. Jeraldine Loots.   Results can be found under chart review under CV PROC. 03/13/2023 3:18 PM Tiombe Tomeo RVT, RDMS

## 2023-03-13 NOTE — Discharge Instructions (Addendum)
Discussed, with your swelling in your lower extremities very important you follow-up with your physician to discuss your medications and arrange appropriate ongoing outpatient care.  Return here for concerning changes.  Continue your efforts to work with social workers for housing assistance.

## 2023-03-26 ENCOUNTER — Emergency Department (HOSPITAL_COMMUNITY): Payer: Commercial Managed Care - HMO

## 2023-03-26 ENCOUNTER — Other Ambulatory Visit: Payer: Self-pay

## 2023-03-26 ENCOUNTER — Observation Stay (HOSPITAL_COMMUNITY)
Admission: EM | Admit: 2023-03-26 | Discharge: 2023-03-27 | Disposition: A | Discharge status: Home / Self Care | Payer: Commercial Managed Care - HMO | Attending: Obstetrics and Gynecology | Admitting: Obstetrics and Gynecology

## 2023-03-26 ENCOUNTER — Encounter (HOSPITAL_COMMUNITY): Payer: Self-pay

## 2023-03-26 DIAGNOSIS — F17210 Nicotine dependence, cigarettes, uncomplicated: Secondary | ICD-10-CM | POA: Insufficient documentation

## 2023-03-26 DIAGNOSIS — Z79899 Other long term (current) drug therapy: Secondary | ICD-10-CM | POA: Diagnosis not present

## 2023-03-26 DIAGNOSIS — R0602 Shortness of breath: Secondary | ICD-10-CM | POA: Diagnosis present

## 2023-03-26 DIAGNOSIS — J9601 Acute respiratory failure with hypoxia: Secondary | ICD-10-CM | POA: Diagnosis not present

## 2023-03-26 DIAGNOSIS — J4531 Mild persistent asthma with (acute) exacerbation: Principal | ICD-10-CM | POA: Diagnosis present

## 2023-03-26 DIAGNOSIS — J4541 Moderate persistent asthma with (acute) exacerbation: Secondary | ICD-10-CM

## 2023-03-26 DIAGNOSIS — Z1152 Encounter for screening for COVID-19: Secondary | ICD-10-CM | POA: Diagnosis not present

## 2023-03-26 DIAGNOSIS — J069 Acute upper respiratory infection, unspecified: Principal | ICD-10-CM | POA: Insufficient documentation

## 2023-03-26 DIAGNOSIS — F17200 Nicotine dependence, unspecified, uncomplicated: Secondary | ICD-10-CM | POA: Diagnosis present

## 2023-03-26 DIAGNOSIS — R0902 Hypoxemia: Secondary | ICD-10-CM | POA: Diagnosis not present

## 2023-03-26 LAB — I-STAT VENOUS BLOOD GAS, ED
Acid-base deficit: 3 mmol/L — ABNORMAL HIGH (ref 0.0–2.0)
Bicarbonate: 20.9 mmol/L (ref 20.0–28.0)
Calcium, Ion: 1.06 mmol/L — ABNORMAL LOW (ref 1.15–1.40)
HCT: 36 % (ref 36.0–46.0)
Hemoglobin: 12.2 g/dL (ref 12.0–15.0)
O2 Saturation: 92 %
Potassium: 3 mmol/L — ABNORMAL LOW (ref 3.5–5.1)
Sodium: 140 mmol/L (ref 135–145)
TCO2: 22 mmol/L (ref 22–32)
pCO2, Ven: 31.4 mmHg — ABNORMAL LOW (ref 44–60)
pH, Ven: 7.431 — ABNORMAL HIGH (ref 7.25–7.43)
pO2, Ven: 61 mmHg — ABNORMAL HIGH (ref 32–45)

## 2023-03-26 LAB — BASIC METABOLIC PANEL
Anion gap: 7 (ref 5–15)
BUN: 13 mg/dL (ref 6–20)
CO2: 23 mmol/L (ref 22–32)
Calcium: 8.5 mg/dL — ABNORMAL LOW (ref 8.9–10.3)
Chloride: 109 mmol/L (ref 98–111)
Creatinine, Ser: 0.9 mg/dL (ref 0.44–1.00)
GFR, Estimated: >60 mL/min (ref >60)
Glucose, Bld: 95 mg/dL (ref 70–99)
Potassium: 4 mmol/L (ref 3.5–5.1)
Sodium: 139 mmol/L (ref 135–145)

## 2023-03-26 LAB — CBC
HCT: 34.1 % — ABNORMAL LOW (ref 36.0–46.0)
Hemoglobin: 10.6 g/dL — ABNORMAL LOW (ref 12.0–15.0)
MCH: 26.5 pg (ref 26.0–34.0)
MCHC: 31.1 g/dL (ref 30.0–36.0)
MCV: 85.3 fL (ref 80.0–100.0)
Platelets: 227 10*3/uL (ref 150–400)
RBC: 4 MIL/uL (ref 3.87–5.11)
RDW: 14.3 % (ref 11.5–15.5)
WBC: 8.5 10*3/uL (ref 4.0–10.5)
nRBC: 0 % (ref 0.0–0.2)

## 2023-03-26 LAB — CBC WITH DIFFERENTIAL/PLATELET
Abs Immature Granulocytes: 0.01 10*3/uL (ref 0.00–0.07)
Basophils Absolute: 0 10*3/uL (ref 0.0–0.1)
Basophils Relative: 1 %
Eosinophils Absolute: 0.2 10*3/uL (ref 0.0–0.5)
Eosinophils Relative: 4 %
HCT: 36.3 % (ref 36.0–46.0)
Hemoglobin: 11.3 g/dL — ABNORMAL LOW (ref 12.0–15.0)
Immature Granulocytes: 0 %
Lymphocytes Relative: 18 %
Lymphs Abs: 1 10*3/uL (ref 0.7–4.0)
MCH: 27 pg (ref 26.0–34.0)
MCHC: 31.1 g/dL (ref 30.0–36.0)
MCV: 86.6 fL (ref 80.0–100.0)
Monocytes Absolute: 0.4 10*3/uL (ref 0.1–1.0)
Monocytes Relative: 7 %
Neutro Abs: 3.8 10*3/uL (ref 1.7–7.7)
Neutrophils Relative %: 70 %
Platelets: 241 10*3/uL (ref 150–400)
RBC: 4.19 MIL/uL (ref 3.87–5.11)
RDW: 14.2 % (ref 11.5–15.5)
WBC: 5.4 10*3/uL (ref 4.0–10.5)
nRBC: 0 % (ref 0.0–0.2)

## 2023-03-26 LAB — TROPONIN I (HIGH SENSITIVITY)
Troponin I (High Sensitivity): 12 ng/L (ref <18)
Troponin I (High Sensitivity): 44 ng/L — ABNORMAL HIGH (ref <18)

## 2023-03-26 LAB — I-STAT BETA HCG BLOOD, ED (MC, WL, AP ONLY): I-stat hCG, quantitative: <5 m[IU]/mL (ref <5)

## 2023-03-26 LAB — HIV ANTIBODY (ROUTINE TESTING W REFLEX): HIV Screen 4th Generation wRfx: NONREACTIVE

## 2023-03-26 LAB — BRAIN NATRIURETIC PEPTIDE: B Natriuretic Peptide: 39.7 pg/mL (ref 0.0–100.0)

## 2023-03-26 LAB — MAGNESIUM: Magnesium: 1.9 mg/dL (ref 1.7–2.4)

## 2023-03-26 LAB — CREATININE, SERUM
Creatinine, Ser: 0.88 mg/dL (ref 0.44–1.00)
GFR, Estimated: >60 mL/min (ref >60)

## 2023-03-26 LAB — SARS CORONAVIRUS 2 BY RT PCR: SARS Coronavirus 2 by RT PCR: NEGATIVE

## 2023-03-26 LAB — GROUP A STREP BY PCR: Group A Strep by PCR: NOT DETECTED

## 2023-03-26 MED ORDER — METHYLPREDNISOLONE SODIUM SUCC 125 MG IJ SOLR
125.0000 mg | Freq: Once | INTRAMUSCULAR | Status: AC
  Administered 2023-03-26: 125 mg via INTRAVENOUS
  Filled 2023-03-26: qty 2

## 2023-03-26 MED ORDER — ENOXAPARIN SODIUM 40 MG/0.4ML IJ SOSY
40.0000 mg | PREFILLED_SYRINGE | INTRAMUSCULAR | Status: DC
  Administered 2023-03-26: 40 mg via SUBCUTANEOUS
  Filled 2023-03-26: qty 0.4

## 2023-03-26 MED ORDER — ACETAMINOPHEN 650 MG RE SUPP: 650.0000 mg | Freq: Four times a day (QID) | RECTAL | Status: DC | PRN

## 2023-03-26 MED ORDER — MAGNESIUM SULFATE 2 GM/50ML IV SOLN
2.0000 g | Freq: Once | INTRAVENOUS | Status: AC
  Administered 2023-03-26: 2 g via INTRAVENOUS
  Filled 2023-03-26: qty 50

## 2023-03-26 MED ORDER — IPRATROPIUM BROMIDE 0.02 % IN SOLN
0.5000 mg | Freq: Once | RESPIRATORY_TRACT | Status: AC
  Administered 2023-03-26: 0.5 mg via RESPIRATORY_TRACT
  Filled 2023-03-26: qty 2.5

## 2023-03-26 MED ORDER — PREDNISONE 50 MG PO TABS
50.0000 mg | ORAL_TABLET | Freq: Every day | ORAL | Status: DC
  Administered 2023-03-27: 50 mg via ORAL
  Filled 2023-03-26: qty 1

## 2023-03-26 MED ORDER — IPRATROPIUM-ALBUTEROL 0.5-2.5 (3) MG/3ML IN SOLN
3.0000 mL | Freq: Four times a day (QID) | RESPIRATORY_TRACT | Status: DC
  Administered 2023-03-27 (×3): 3 mL via RESPIRATORY_TRACT
  Filled 2023-03-26 (×3): qty 3

## 2023-03-26 MED ORDER — ALBUTEROL SULFATE (2.5 MG/3ML) 0.083% IN NEBU
2.5000 mg | INHALATION_SOLUTION | RESPIRATORY_TRACT | Status: DC | PRN
  Administered 2023-03-26: 2.5 mg via RESPIRATORY_TRACT
  Filled 2023-03-26: qty 3

## 2023-03-26 MED ORDER — ACETAMINOPHEN 325 MG PO TABS: 650.0000 mg | ORAL_TABLET | Freq: Four times a day (QID) | ORAL | Status: DC | PRN

## 2023-03-26 MED ORDER — IOHEXOL 350 MG/ML SOLN
75.0000 mL | Freq: Once | INTRAVENOUS | Status: AC | PRN
  Administered 2023-03-26: 75 mL via INTRAVENOUS

## 2023-03-26 MED ORDER — PREDNISONE 10 MG PO TABS: 40.0000 mg | ORAL_TABLET | Freq: Every day | ORAL | 0 refills | Status: AC

## 2023-03-26 MED ORDER — BISACODYL 5 MG PO TBEC: 5.0000 mg | DELAYED_RELEASE_TABLET | Freq: Every day | ORAL | Status: DC | PRN

## 2023-03-26 MED ORDER — ALBUTEROL SULFATE (2.5 MG/3ML) 0.083% IN NEBU
10.0000 mg/h | INHALATION_SOLUTION | RESPIRATORY_TRACT | Status: AC
  Administered 2023-03-26: 10 mg/h via RESPIRATORY_TRACT
  Filled 2023-03-26: qty 12

## 2023-03-26 MED ORDER — ONDANSETRON HCL 4 MG/2ML IJ SOLN
4.0000 mg | Freq: Once | INTRAMUSCULAR | Status: AC
  Administered 2023-03-26: 4 mg via INTRAVENOUS
  Filled 2023-03-26: qty 2

## 2023-03-26 MED ORDER — SODIUM CHLORIDE 0.9 % IV BOLUS
1000.0000 mL | Freq: Once | INTRAVENOUS | Status: AC
  Administered 2023-03-26: 1000 mL via INTRAVENOUS

## 2023-03-26 MED ORDER — GUAIFENESIN-CODEINE 100-10 MG/5ML PO SOLN: 10.0000 mL | Freq: Four times a day (QID) | ORAL | Status: DC | PRN

## 2023-03-26 MED ORDER — ALBUTEROL SULFATE (2.5 MG/3ML) 0.083% IN NEBU: 2.5000 mg | INHALATION_SOLUTION | Freq: Four times a day (QID) | RESPIRATORY_TRACT | Status: DC

## 2023-03-26 MED ORDER — ALBUTEROL SULFATE (2.5 MG/3ML) 0.083% IN NEBU
2.5000 mg | INHALATION_SOLUTION | Freq: Once | RESPIRATORY_TRACT | Status: AC
  Administered 2023-03-26: 2.5 mg via RESPIRATORY_TRACT
  Filled 2023-03-26: qty 3

## 2023-03-26 MED ORDER — ALBUTEROL SULFATE HFA 108 (90 BASE) MCG/ACT IN AERS
2.0000 | INHALATION_SPRAY | Freq: Once | RESPIRATORY_TRACT | Status: AC
  Administered 2023-03-26: 2 via RESPIRATORY_TRACT
  Filled 2023-03-26: qty 6.7

## 2023-03-26 MED ORDER — IBUPROFEN 600 MG PO TABS
600.0000 mg | ORAL_TABLET | Freq: Four times a day (QID) | ORAL | Status: DC | PRN
  Administered 2023-03-26: 600 mg via ORAL
  Filled 2023-03-26: qty 1

## 2023-03-26 MED ORDER — IPRATROPIUM BROMIDE 0.02 % IN SOLN: 0.5000 mg | Freq: Four times a day (QID) | RESPIRATORY_TRACT | Status: DC

## 2023-03-26 MED ORDER — ALBUTEROL SULFATE (2.5 MG/3ML) 0.083% IN NEBU
15.0000 mg/h | INHALATION_SOLUTION | RESPIRATORY_TRACT | Status: AC
  Administered 2023-03-26: 15 mg/h via RESPIRATORY_TRACT
  Filled 2023-03-26: qty 18

## 2023-03-26 MED ORDER — ALBUTEROL SULFATE (2.5 MG/3ML) 0.083% IN NEBU
2.5000 mg | INHALATION_SOLUTION | Freq: Once | RESPIRATORY_TRACT | Status: DC
  Filled 2023-03-26: qty 3

## 2023-03-26 NOTE — ED Notes (Signed)
ED TO INPATIENT HANDOFF REPORT  ED Nurse Name and Phone #: [phone]  S Name/Age/Gender Debra Washington 42 y.o. female Room/Bed: 032C/032C  Code Status   Code Status: Full Code  Home/SNF/Other Home Patient oriented to: self, place, time, and situation Is this baseline? Yes   Triage Complete: Triage complete  Chief Complaint Asthma exacerbation, non-allergic, mild persistent [J45.31]  Triage Note Pt. Stated, I've had a bad cough which has made my throat scratchy and hurt. Im still having ankle swelling. The swelling is ongoing and nobody knows why. Last night I started having SOB, and I do have asthma but left my inhaler in car.    Allergies Allergies  Allergen Reactions   Furosemide Swelling and Other (See Comments)    Patient feels this might have caused her feet to swell   Penicillins Other (See Comments)    Childhood allergy Has patient had a PCN reaction causing immediate rash, facial/tongue/throat swelling, SOB or lightheadedness with hypotension: No Has patient had a PCN reaction causing severe rash involving mucus membranes or skin necrosis: No Has patient had a PCN reaction that required hospitalization: No Has patient had a PCN reaction occurring within the last 10 years: No If all of the above answers are "NO", then may proceed with Cephalosporin use.     Level of Care/Admitting Diagnosis ED Disposition     ED Disposition  Admit   Condition  --   Comment  Hospital Area: [facility] [[id number]]  Level of Care: Telemetry Medical [[id number]]  May admit patient to [facility] or [facility] if equivalent level of care is available:: No  Covid Evaluation: Asymptomatic - no recent exposure (last 10 days) testing not required  Diagnosis: Asthma exacerbation, non-allergic, mild persistent [[id number]]  Admitting Physician: Buena Irish [[id number]]  Attending Physician: Buena Irish [phone]  Certification:: I certify this patient will need inpatient  services for at least 2 midnights  Estimated Length of Stay: 2          B Medical/Surgery History Past Medical History:  Diagnosis Date   Asthma    Hx of ectopic pregnancy 2006   Kidney stones    Migraine    Obesity    Past Surgical History:  Procedure Laterality Date   CARPAL TUNNEL RELEASE Right 01/2018   IR ANGIO INTRA EXTRACRAN SEL COM CAROTID INNOMINATE BILAT MOD SED  07/05/2018   IR ANGIO VERTEBRAL SEL VERTEBRAL BILAT MOD SED  07/05/2018   SALPINGOOPHORECTOMY  2006   pt not sure if her ovaries were removed      A IV Location/Drains/Wounds Patient Lines/Drains/Airways Status     Active Line/Drains/Airways     Name Placement date Placement time Site Days   Peripheral IV 03/26/23 22 G Left Hand 03/26/23  1005  Hand  less than 1   Peripheral IV 03/26/23 18 G Right Antecubital 03/26/23  1403  Antecubital  less than 1            Intake/Output Last 24 hours  Intake/Output Summary (Last 24 hours) at 03/26/2023 2052 Last data filed at 03/26/2023 2033 Gross per 24 hour  Intake 936.36 ml  Output --  Net 936.36 ml    Labs/Imaging Results for orders placed or performed during the hospital encounter of 03/26/23 (from the past 48 hour(s))  Basic metabolic panel     Status: Abnormal   Collection Time: 03/26/23  9:20 AM  Result Value Ref Range   Sodium 139 135 - 145 mmol/L   Potassium 4.0  3.5 - 5.1 mmol/L   Chloride 109 98 - 111 mmol/L   CO2 23 22 - 32 mmol/L   Glucose, Bld 95 70 - 99 mg/dL    Comment: Glucose reference range applies only to samples taken after fasting for at least 8 hours.   BUN 13 6 - 20 mg/dL   Creatinine, Ser 1.61 0.44 - 1.00 mg/dL   Calcium 8.5 (L) 8.9 - 10.3 mg/dL   GFR, Estimated >09 >60 mL/min    Comment: (NOTE) Calculated using the CKD-EPI Creatinine Equation (2021)    Anion gap 7 5 - 15    Comment: Performed at [facility], [address] [place], [place] [address]  CBC with Differential     Status: Abnormal   Collection  Time: 03/26/23  9:20 AM  Result Value Ref Range   WBC 5.4 4.0 - 10.5 K/uL   RBC 4.19 3.87 - 5.11 MIL/uL   Hemoglobin 11.3 (L) 12.0 - 15.0 g/dL   HCT 81.1 91.4 - 78.2 %   MCV 86.6 80.0 - 100.0 fL   MCH 27.0 26.0 - 34.0 pg   MCHC 31.1 30.0 - 36.0 g/dL   RDW 95.6 21.3 - 08.6 %   Platelets 241 150 - 400 K/uL   nRBC 0.0 0.0 - 0.2 %   Neutrophils Relative % 70 %   Neutro Abs 3.8 1.7 - 7.7 K/uL   Lymphocytes Relative 18 %   Lymphs Abs 1.0 0.7 - 4.0 K/uL   Monocytes Relative 7 %   Monocytes Absolute 0.4 0.1 - 1.0 K/uL   Eosinophils Relative 4 %   Eosinophils Absolute 0.2 0.0 - 0.5 K/uL   Basophils Relative 1 %   Basophils Absolute 0.0 0.0 - 0.1 K/uL   Immature Granulocytes 0 %   Abs Immature Granulocytes 0.01 0.00 - 0.07 K/uL    Comment: Performed at [facility], [address] [place], [place] [address]  Brain natriuretic peptide     Status: None   Collection Time: 03/26/23  9:20 AM  Result Value Ref Range   B Natriuretic Peptide 39.7 0.0 - 100.0 pg/mL    Comment: Performed at [facility], [address] [place], [place] [address]  SARS Coronavirus 2 by RT PCR (hospital order, performed in [facility] hospital lab) *cepheid single result test* Anterior Nasal Swab     Status: None   Collection Time: 03/26/23  9:20 AM   Specimen: Anterior Nasal Swab  Result Value Ref Range   SARS Coronavirus 2 by RT PCR NEGATIVE NEGATIVE    Comment: Performed at [facility], [address] [place], [place] [address]  Group A Strep by PCR     Status: None   Collection Time: 03/26/23  9:20 AM   Specimen: Anterior Nasal Swab; Sterile Swab  Result Value Ref Range   Group A Strep by PCR NOT DETECTED NOT DETECTED    Comment: Performed at [facility], [address], [place], [place] [address]  Magnesium     Status: None   Collection Time: 03/26/23  9:59 AM  Result Value Ref Range   Magnesium 1.9 1.7 - 2.4 mg/dL    Comment: Performed at [facility], [address] [place], [place] [address]  I-Stat beta hCG blood, ED     Status: None   Collection Time: 03/26/23  1:31 PM  Result Value Ref Range   I-stat hCG, quantitative <5.0 <5 mIU/mL  Comment 3            Comment:   GEST. AGE      CONC.  (mIU/mL)   <=1 WEEK        5 - 50     2 WEEKS       50 - 500     3 WEEKS       100 - 10,000     4 WEEKS     1,000 - 30,000        FEMALE AND NON-PREGNANT FEMALE:     LESS THAN 5 mIU/mL   I-Stat venous blood gas, ([facility] ED, [facility], [facility])     Status: Abnormal   Collection Time: 03/26/23  1:33 PM  Result Value Ref Range   pH, Ven 7.431 (H) 7.25 - 7.43   pCO2, Ven 31.4 (L) 44 - 60 mmHg   pO2, Ven 61 (H) 32 - 45 mmHg   Bicarbonate 20.9 20.0 - 28.0 mmol/L   TCO2 22 22 - 32 mmol/L   O2 Saturation 92 %   Acid-base deficit 3.0 (H) 0.0 - 2.0 mmol/L   Sodium 140 135 - 145 mmol/L   Potassium 3.0 (L) 3.5 - 5.1 mmol/L   Calcium, Ion 1.06 (L) 1.15 - 1.40 mmol/L   HCT 36.0 36.0 - 46.0 %   Hemoglobin 12.2 12.0 - 15.0 g/dL   Sample type VENOUS   Troponin I (High Sensitivity)     Status: None   Collection Time: 03/26/23  4:13 PM  Result Value Ref Range   Troponin I (High Sensitivity) 12 <18 ng/L    Comment: (NOTE) Elevated high sensitivity troponin I (hsTnI) values and significant  changes across serial measurements may suggest ACS but many other  chronic and acute conditions are known to elevate hsTnI results.  Refer to the "Links" section for chest pain algorithms and additional  guidance. Performed at [facility], [address], [place], [place] [address]    CT Angio Chest PE W and/or Wo Contrast  Result Date: 03/26/2023 CLINICAL DATA:  Cough and acutely worsening shortness of breath EXAM: CT ANGIOGRAPHY CHEST WITH CONTRAST TECHNIQUE: Multidetector CT imaging of the chest was performed using the standard protocol during bolus administration of intravenous contrast. Multiplanar CT image reconstructions and MIPs were obtained to evaluate the vascular anatomy.  RADIATION DOSE REDUCTION: This exam was performed according to the departmental dose-optimization program which includes automated exposure control, adjustment of the mA and/or kV according to patient size and/or use of iterative reconstruction technique. CONTRAST:  75mL OMNIPAQUE IOHEXOL 350 MG/ML SOLN COMPARISON:  Chest radiograph dated 03/26/2023 FINDINGS: Cardiovascular: The study is high quality for the evaluation of pulmonary embolism. There are no filling defects in the central, lobar, segmental or subsegmental pulmonary artery branches to suggest acute pulmonary embolism. Great vessels are normal in course and caliber. Normal heart size. No significant pericardial fluid/thickening. Mediastinum/Nodes: Imaged thyroid gland without nodules meeting criteria for imaging follow-up by size. Normal esophagus. No pathologically enlarged axillary, supraclavicular, mediastinal, or hilar lymph nodes. Lungs/Pleura: The central airways are patent. Diffuse bronchial wall thickening. Subsegmental mucous plugging in the left lower lobe with subsegmental left lower lobe atelectasis. A small degree of right lower lobe subsegmental atelectasis. Otherwise, no focal consolidation. No pneumothorax. No pleural effusion. Upper abdomen: Normal. Musculoskeletal: No acute or abnormal lytic or blastic osseous lesions. Review of the MIP images confirms the above findings. IMPRESSION: 1. No evidence of pulmonary embolism. 2. Diffuse bronchial wall thickening with subsegmental  mucous plugging in the left lower lobe and subsegmental left lower lobe atelectasis. Findings are suggestive of bronchitis. Electronically Signed   By: Agustin Cree M.D.   On: 03/26/2023 16:02   DG Chest 2 View  Result Date: 03/26/2023 CLINICAL DATA:  Progressive shortness of breath. Bilateral lower extremity swelling. EXAM: CHEST - 2 VIEW COMPARISON:  None Available. FINDINGS: The heart size and mediastinal contours are within normal limits. Both lungs are clear.  The visualized skeletal structures are unremarkable. IMPRESSION: No active cardiopulmonary disease. Electronically Signed   By: Marin Roberts M.D.   On: 03/26/2023 09:44    Pending Labs Unresulted Labs (From admission, onward)     Start     Ordered   04/02/23 0500  Creatinine, serum  (enoxaparin (LOVENOX)    CrCl >/= 30 ml/min)  Weekly,   R     Comments: while on enoxaparin therapy    03/26/23 2020   03/26/23 2011  CBC  (enoxaparin (LOVENOX)    CrCl >/= 30 ml/min)  Once,   R       Comments: Baseline for enoxaparin therapy IF NOT ALREADY DRAWN.  Notify MD if PLT < 100 K.    03/26/23 2020   03/26/23 2011  Creatinine, serum  (enoxaparin (LOVENOX)    CrCl >/= 30 ml/min)  Once,   R       Comments: Baseline for enoxaparin therapy IF NOT ALREADY DRAWN.    03/26/23 2020   03/26/23 2010  HIV Antibody (routine testing w rflx)  (HIV Antibody (Routine testing w reflex) panel)  Once,   R        03/26/23 2020            Vitals/Pain Today's Vitals   03/26/23 1809 03/26/23 1900 03/26/23 2000 03/26/23 2008  BP:  (!) 149/75 137/72 137/72  Pulse:  (!) 109 (!) 107 (!) 110  Resp:    (!) 29  Temp: 98.6 F (37 C)   97.9 F (36.6 C)  TempSrc: Oral   Oral  SpO2:  97% 97% 98%  PainSc:        Isolation Precautions No active isolations  Medications Medications  albuterol (PROVENTIL) (2.5 MG/3ML) 0.083% nebulizer solution (0 mg/hr Nebulization Stopped 03/26/23 2009)  albuterol (PROVENTIL) (2.5 MG/3ML) 0.083% nebulizer solution (0 mg/hr Nebulization Stopped 03/26/23 2009)  albuterol (PROVENTIL) (2.5 MG/3ML) 0.083% nebulizer solution 2.5 mg (has no administration in time range)  enoxaparin (LOVENOX) injection 40 mg (has no administration in time range)  bisacodyl (DULCOLAX) EC tablet 5 mg (has no administration in time range)  acetaminophen (TYLENOL) tablet 650 mg (has no administration in time range)    Or  acetaminophen (TYLENOL) suppository 650 mg (has no administration in time range)   albuterol (PROVENTIL) (2.5 MG/3ML) 0.083% nebulizer solution 2.5 mg (has no administration in time range)  ipratropium (ATROVENT) nebulizer solution 0.5 mg (has no administration in time range)  albuterol (PROVENTIL) (2.5 MG/3ML) 0.083% nebulizer solution 2.5 mg (has no administration in time range)  guaiFENesin-codeine 100-10 MG/5ML solution 10 mL (has no administration in time range)  predniSONE (DELTASONE) tablet 50 mg (has no administration in time range)  ibuprofen (ADVIL) tablet 600 mg (has no administration in time range)  albuterol (VENTOLIN HFA) 108 (90 Base) MCG/ACT inhaler 2 puff (2 puffs Inhalation Given 03/26/23 1011)  methylPREDNISolone sodium succinate (SOLU-MEDROL) 125 mg/2 mL injection 125 mg (125 mg Intravenous Given 03/26/23 1009)  ipratropium (ATROVENT) nebulizer solution 0.5 mg (0.5 mg Nebulization Given 03/26/23 1013)  magnesium  sulfate IVPB 2 g 50 mL (0 g Intravenous Stopped 03/26/23 1551)  iohexol (OMNIPAQUE) 350 MG/ML injection 75 mL (75 mLs Intravenous Contrast Given 03/26/23 1541)  sodium chloride 0.9 % bolus 1,000 mL (0 mLs Intravenous Stopped 03/26/23 2033)  albuterol (PROVENTIL) (2.5 MG/3ML) 0.083% nebulizer solution 2.5 mg (2.5 mg Nebulization Given 03/26/23 1848)  ondansetron (ZOFRAN) injection 4 mg (4 mg Intravenous Given 03/26/23 2014)    Mobility walks     Focused Assessments    R Recommendations: See Admitting Provider Note  Report given to:   Additional Notes: a/o x4, ambulatory, normally not on O2 but is on 3L [place] right now.

## 2023-03-26 NOTE — ED Provider Notes (Signed)
[place] EMERGENCY DEPARTMENT AT [facility] Provider Note   CSN: [id number] Arrival date & time: 03/26/23  4098     History  Chief Complaint  Patient presents with   Shortness of Breath   Cough   Sore Throat   Joint Swelling   Headache    Debra Washington is a 42 y.o. female with past medical history asthma, chronic bronchitis, migraines who presents to the ED complaining of shortness of breath that started last night.  She states that she has chest tightness and shortness of breath similar to previous asthma flares.  She is not currently on any medications for her asthma daily.  Notes that coworkers have been ill recently and she has also had a cough, sore throat, and nasal congestion.  No fever, chills, nausea, vomiting, or diarrhea.  At baseline, patient has lower extremity edema which is managed with Lasix 20 mg at home daily.  She states that this is chronic and unchanged.  No associated lower extremity pain.  Patient states that within the last 2 weeks she has had an ultrasound of both of her lower extremities which were negative for DVT.  No history of DVT/PE.      Home Medications Prior to Admission medications   Medication Sig Start Date End Date Taking? Authorizing Provider  predniSONE (DELTASONE) 10 MG tablet Take 4 tablets (40 mg total) by mouth daily with breakfast for 5 days. 03/26/23 03/31/23 Yes Sofiah Lyne L, PA-C  albuterol (PROVENTIL HFA;VENTOLIN HFA) 108 (90 BASE) MCG/ACT inhaler Inhale 2 puffs into the lungs every 4 (four) hours as needed for wheezing or shortness of breath. For shortness of breath    [provider]  cyclobenzaprine (FLEXERIL) 10 MG tablet Take 1 tablet (10 mg total) by mouth 2 (two) times daily as needed for muscle spasms. 02/17/23   Rising, Lurena Joiner, PA-C  fluticasone-salmeterol (ADVAIR DISKUS) 250-50 MCG/ACT AEPB Inhale 1 puff into the lungs in the morning and at bedtime.    [provider]  furosemide (LASIX) 20 MG  tablet Take 1 tablet (20 mg total) by mouth daily. 03/13/23   Gerhard Munch, MD  ibuprofen (ADVIL) 600 MG tablet Take 1 tablet (600 mg total) by mouth 3 (three) times daily. 02/17/23   Rising, Rebecca, PA-C  ondansetron (ZOFRAN) 4 MG tablet Take 1 tablet (4 mg total) by mouth every 6 (six) hours. 02/18/23   Rancour, Jeannett Senior, MD  pregabalin (LYRICA) 50 MG capsule 1 cap(s) orally 2 times a day for 30 day(s) 08/30/17   [provider]  SUMAtriptan (IMITREX) 25 MG tablet 1 tab(s) orally once    [provider]      Allergies    Penicillins    Review of Systems   Review of Systems  All other systems reviewed and are negative.   Physical Exam Updated Vital Signs BP (!) 162/80   Pulse (!) 107   Temp 98 F (36.7 C) (Oral)   Resp (!) 24   LMP 03/13/2023 (Approximate)   SpO2 100%  Physical Exam Vitals and nursing note reviewed.  Constitutional:      General: She is not in acute distress.    Appearance: Normal appearance.  HENT:     Head: Normocephalic and atraumatic.     Mouth/Throat:     Lips: No lesions.     Mouth: Mucous membranes are moist. No angioedema.     Tongue: Tongue does not deviate from midline.     Pharynx: Oropharynx is clear. Uvula  midline. Posterior oropharyngeal erythema (moderate posterior pharyngeal) present. No pharyngeal swelling, oropharyngeal exudate or uvula swelling.     Tonsils: No tonsillar exudate or tonsillar abscesses. 0 on the right. 0 on the left.  Eyes:     Extraocular Movements: Extraocular movements intact.     Conjunctiva/sclera: Conjunctivae normal.  Neck:     Vascular: No JVD.  Cardiovascular:     Rate and Rhythm: Normal rate and regular rhythm.     Heart sounds: No murmur heard. Pulmonary:     Effort: Tachypnea (mild but able to speak in full sentences) present. No respiratory distress.     Breath sounds: No stridor. Wheezing (diffuse inspiratory and expiratory) present. No decreased breath sounds, rhonchi or rales.      Comments: Normal phonation Chest:     Chest wall: No mass, deformity, tenderness, crepitus or edema.  Abdominal:     General: Abdomen is flat.     Palpations: Abdomen is soft. There is no mass.     Tenderness: There is no abdominal tenderness. There is no guarding or rebound.  Musculoskeletal:        General: Normal range of motion.     Cervical back: Normal range of motion and neck supple.     Right lower leg: No tenderness. Edema (nonpitting, equal bilaterally) present.     Left lower leg: No tenderness. Edema (nonpitting, equal bilaterally) present.  Skin:    General: Skin is warm and dry.     Capillary Refill: Capillary refill takes less than 2 seconds.  Neurological:     General: No focal deficit present.     Mental Status: She is alert and oriented to person, place, and time. Mental status is at baseline.  Psychiatric:        Mood and Affect: Mood normal.        Behavior: Behavior normal.     ED Results / Procedures / Treatments   Labs (all labs ordered are listed, but only abnormal results are displayed) Labs Reviewed  BASIC METABOLIC PANEL - Abnormal; Notable for the following components:      Result Value   Calcium 8.5 (*)    All other components within normal limits  CBC WITH DIFFERENTIAL/PLATELET - Abnormal; Notable for the following components:   Hemoglobin 11.3 (*)    All other components within normal limits  I-STAT VENOUS BLOOD GAS, ED - Abnormal; Notable for the following components:   pH, Ven 7.431 (*)    pCO2, Ven 31.4 (*)    pO2, Ven 61 (*)    Acid-base deficit 3.0 (*)    Potassium 3.0 (*)    Calcium, Ion 1.06 (*)    All other components within normal limits  SARS CORONAVIRUS 2 BY RT PCR  GROUP A STREP BY PCR  BRAIN NATRIURETIC PEPTIDE  MAGNESIUM  I-STAT BETA HCG BLOOD, ED ([facility], [facility], [facility] ONLY)    EKG EKG Interpretation  Date/Time:  Sunday Mar 26 2023 11:08:43 EDT Ventricular Rate:  111 PR Interval:  182 QRS Duration: 82 QT Interval:  308 QTC  Calculation: 419 R Axis:   65 Text Interpretation: Sinus tachycardia RSR' in V1 or V2, right VCD or RVH rate is faster than April 2024 Confirmed by Pricilla Loveless [phone]) on 03/26/2023 1:43:33 PM  Radiology DG Chest 2 View  Result Date: 03/26/2023 CLINICAL DATA:  Progressive shortness of breath. Bilateral lower extremity swelling. EXAM: CHEST - 2 VIEW COMPARISON:  None Available. FINDINGS: The heart size and mediastinal contours are within  normal limits. Both lungs are clear. The visualized skeletal structures are unremarkable. IMPRESSION: No active cardiopulmonary disease. Electronically Signed   By: Marin Roberts M.D.   On: 03/26/2023 09:44    Procedures .Critical Care  Performed by: Tonette Lederer, PA-C Authorized by: Tonette Lederer, PA-C   Critical care provider statement:    Critical care time (minutes):  45   Critical care start time:  03/26/2023 10:00 AM   Critical care end time:  03/26/2023 3:30 PM   Critical care time was exclusive of:  Separately billable procedures and treating other patients and teaching time   Critical care was necessary to treat or prevent imminent or life-threatening deterioration of the following conditions:  Cardiac failure, renal failure, respiratory failure, sepsis, shock and circulatory failure   Critical care was time spent personally by me on the following activities:  Ordering and performing treatments and interventions, development of treatment plan with patient or surrogate, ordering and review of laboratory studies, pulse oximetry, re-evaluation of patient's condition, ordering and review of radiographic studies, evaluation of patient's response to treatment, examination of patient and obtaining history from patient or surrogate     Medications Ordered in ED Medications  albuterol (PROVENTIL) (2.5 MG/3ML) 0.083% nebulizer solution (15 mg/hr Nebulization New Bag/Given 03/26/23 1045)  albuterol (PROVENTIL) (2.5 MG/3ML) 0.083% nebulizer  solution (10 mg/hr Nebulization New Bag/Given 03/26/23 1349)  albuterol (VENTOLIN HFA) 108 (90 Base) MCG/ACT inhaler 2 puff (2 puffs Inhalation Given 03/26/23 1011)  methylPREDNISolone sodium succinate (SOLU-MEDROL) 125 mg/2 mL injection 125 mg (125 mg Intravenous Given 03/26/23 1009)  ipratropium (ATROVENT) nebulizer solution 0.5 mg (0.5 mg Nebulization Given 03/26/23 1013)  magnesium sulfate IVPB 2 g 50 mL (2 g Intravenous New Bag/Given 03/26/23 1421)    ED Course/ Medical Decision Making/ A&P Clinical Course as of 03/27/23 0910  Sun Mar 26, 2023  1542 O2 Device: Nasal Cannula [OZ]    Clinical Course User Index [OZ] Smitty Knudsen, PA-C                             Medical Decision Making Amount and/or Complexity of Data Reviewed Labs: ordered. Decision-making details documented in ED Course. Radiology: ordered. Decision-making details documented in ED Course. ECG/medicine tests: ordered. Decision-making details documented in ED Course.  Risk Prescription drug management. Decision regarding hospitalization.   Medical Decision Making:   Tennile Lacombe is a 42 y.o. female who presented to the ED today with dyspnea detailed above.    Patient's presentation is complicated by their history of asthma.  Complete initial physical exam performed, notably the patient was in no acute distress. She had mild tachypnea, inspiratory and expiratory wheezing but was able to speak in full sentences without stridor, changes in phonation, evidence of airway obstruction, or muscle retractions. RRR. Abdomen soft and nontender. Nonpitting equal edema to BLE without overlying skin changes pt states at baseline. Neurologically intact.    Reviewed and confirmed nursing documentation for past medical history, family history, social history.    Initial Assessment:   With the patient's presentation, differential diagnosis includes but is not limited to  Cardiac (AHF, pericardial effusion and tamponade,  arrhythmias, ischemia, etc) Respiratory (COPD, asthma, pneumonia, pneumothorax, primary pulmonary hypertension, PE/VQ mismatch) Hematological (anemia) Neuromuscular (ALS, Guillain-Barr, etc)  This is most consistent with an acute complicated illness  Initial Plan:  Screening labs including CBC and Metabolic panel to evaluate for infectious or metabolic etiology of disease.  CXR  to evaluate for structural/infectious intrathoracic pathology.  EKG to evaluate for cardiac pathology BNP to evaluate for fluid overload Beta hcg to assess for pregnancy Strep to assess for pharyngitis COVID swab to assess for viral etiology Symptomatic treatment Objective evaluation as below reviewed   Initial Study Results:   Laboratory  All laboratory results reviewed without evidence of clinically relevant pathology.   Exceptions include: pH 7.431   EKG EKG was reviewed independently. Rate, rhythm, axis, intervals all examined and without medically relevant abnormality. ST segments without concerns for elevations.    Radiology:  All images reviewed independently. Agree with radiology report at this time.   DG Chest 2 View  Result Date: 03/26/2023 CLINICAL DATA:  Progressive shortness of breath. Bilateral lower extremity swelling. EXAM: CHEST - 2 VIEW COMPARISON:  None Available. FINDINGS: The heart size and mediastinal contours are within normal limits. Both lungs are clear. The visualized skeletal structures are unremarkable. IMPRESSION: No active cardiopulmonary disease. Electronically Signed   By: Marin Roberts M.D.   On: 03/26/2023 09:44   VAS [place] LOWER EXTREMITY VENOUS (DVT) (7a-7p)  Result Date: 03/13/2023  Lower Venous DVT Study Patient Name:  Debra Washington  Date of Exam:   03/13/2023 Medical Rec #: [id number]        Accession #:    [id number] Date of Birth: [birth year]         Patient Gender: F Patient Age:   63 years Exam Location:  [facility] Procedure:      VAS [place] LOWER EXTREMITY  VENOUS (DVT) Referring Phys: Molly Maduro LOCKWOOD --------------------------------------------------------------------------------  Indications: Swelling.  Limitations: Pain intolerance. Comparison Study: previous exam on 02/24/20 was negative for DVT Performing Technologist: Ernestene Mention RVT, RDMS  Examination Guidelines: A complete evaluation includes B-mode imaging, spectral Doppler, color Doppler, and power Doppler as needed of all accessible portions of each vessel. Bilateral testing is considered an integral part of a complete examination. Limited examinations for reoccurring indications may be performed as noted. The reflux portion of the exam is performed with the patient in reverse Trendelenburg.  +---------+---------------+---------+-----------+----------+--------------+ RIGHT    CompressibilityPhasicitySpontaneityPropertiesThrombus Aging +---------+---------------+---------+-----------+----------+--------------+ CFV      Full           Yes      Yes                                 +---------+---------------+---------+-----------+----------+--------------+ SFJ      Full                                                        +---------+---------------+---------+-----------+----------+--------------+ FV Prox  Full           Yes      Yes                                 +---------+---------------+---------+-----------+----------+--------------+ FV Mid   Full           Yes      Yes                                 +---------+---------------+---------+-----------+----------+--------------+ FV DistalFull  Yes      Yes                                 +---------+---------------+---------+-----------+----------+--------------+ PFV      Full                                                        +---------+---------------+---------+-----------+----------+--------------+ POP      Full           Yes      Yes                                  +---------+---------------+---------+-----------+----------+--------------+ PTV      Full                                                        +---------+---------------+---------+-----------+----------+--------------+ PERO     Full                                                        +---------+---------------+---------+-----------+----------+--------------+   +----+---------------+---------+-----------+----------+--------------+ LEFTCompressibilityPhasicitySpontaneityPropertiesThrombus Aging +----+---------------+---------+-----------+----------+--------------+ CFV Full           Yes      Yes                                 +----+---------------+---------+-----------+----------+--------------+     Summary: RIGHT: - There is no evidence of deep vein thrombosis in the lower extremity.  - No cystic structure found in the popliteal fossa. subcutaneous edema in area of calf and ankle  LEFT: - No evidence of common femoral vein obstruction.  *See table(s) above for measurements and observations. Electronically signed by Gerarda Fraction on 03/13/2023 at 7:32:06 PM.    Final    DG Foot Complete Right  Result Date: 03/13/2023 CLINICAL DATA:  Right foot pain. EXAM: RIGHT FOOT COMPLETE - 3+ VIEW COMPARISON:  None Available. FINDINGS: There is no evidence of fracture or dislocation. There is no evidence of arthropathy or other focal bone abnormality. Soft tissues swelling about the dorsum of the foot. IMPRESSION: Soft tissue swelling about the dorsum of the foot. No acute fracture or dislocation. Electronically Signed   By: Larose Hires D.O.   On: 03/13/2023 12:12   DG Foot Complete Left  Result Date: 03/13/2023 CLINICAL DATA:  Left foot pain. EXAM: LEFT FOOT - COMPLETE 3+ VIEW COMPARISON:  None Available. FINDINGS: There is no evidence of fracture or dislocation. There is no evidence of arthropathy or other focal bone abnormality. Soft tissues are swelling about the dorsum of the foot.  IMPRESSION: Soft tissue swelling about the dorsum of the foot. No fracture or dislocation. Electronically Signed   By: Larose Hires D.O.   On: 03/13/2023 12:12   DG Ankle 2 Views Right  Result Date: 03/13/2023 CLINICAL DATA:  Right  ankle pain EXAM: RIGHT ANKLE - 2 VIEW COMPARISON:  None Available. FINDINGS: There is no evidence of fracture, dislocation, or joint effusion. There is no evidence of arthropathy. Small plantar calcaneal spurring. Soft tissue swelling about the ankle. IMPRESSION: Soft tissue swelling about the ankle. No acute fracture or dislocation. Electronically Signed   By: Larose Hires D.O.   On: 03/13/2023 12:11      Final Assessment and Plan:   42 year old female presenting to ED c/o dyspnea. Hx asthma, on initial exam with significant inspiratory and expiratory wheezing and mild tachypnea though no respiratory distress, speaking in full sentences with ease, maintaining oxygen saturation on room air. Workup initiated as above. Breathing treatments, steroids also given. Following initial medications, initial improvement in wheezing and pt comfortable on re-examination. Labs reassuring, chest XR negative. Initially discussed results and plan with pt who wanted to be discharged home. Unfortunately, briefly following this pt became very lethargic, tachypneic, and with oxygen saturation high 80s on room air. Placed on 3L [place]. No history of oxygen requirement. Additional breathing treatments, Mg ordered. Pt remained tachypneic despite this. CT PE study ordered. With decline in respiratory status and new oxygen requirement, discussed with pt recommendation for admission for which she is agreeable. Pending CT PE study and admission, pt care transitioned to Smitty Knudsen, PA-C. Pt stable at time of care transition.  Discussed plan with attending physician w ho co-signed this note who agreed with management.    Clinical Impression:  1. Mild persistent asthma with acute exacerbation   2. Viral  upper respiratory tract infection   3. Hypoxia      Data Unavailable            Final Clinical Impression(s) / ED Diagnoses Final diagnoses:  Mild persistent asthma with acute exacerbation  Viral upper respiratory tract infection  Hypoxia    Rx / DC Orders     Tonette Lederer, PA-C 03/27/23 0910    Pricilla Loveless, MD 03/29/23 [phone]

## 2023-03-26 NOTE — ED Triage Notes (Addendum)
Pt. Stated, I've had a bad cough which has made my throat scratchy and hurt. Im still having ankle swelling. The swelling is ongoing and nobody knows why. Last night I started having SOB, and I do have asthma but left my inhaler in car.

## 2023-03-26 NOTE — H&P (Signed)
History and Physical    Patient: Debra Washington ZHY:[id number] DOB: 1981/10/16 DOA: 03/26/2023 DOS: the patient was seen and examined on 03/26/2023 PCP: Norm Salt, PA  Patient coming from: home   Chief Complaint:  Chief Complaint  Patient presents with   Shortness of Breath   Cough   Sore Throat   Joint Swelling   Headache   HPI: Debra Washington is a 42 y.o. female with medical history significant for mild asthma Who reports that suddenly last night she started coughing and felt itching in her throat then wheezy and short of breath.  The patient has had mild at many mild asthma exacerbations in the past but never did she gets so short of breath so quickly.  This is the worst exacerbation she has ever had.  Usually she gets antibiotics and cough syrup from her doctor and feels better.  Presented to the emergency room this morning because she could not catch her breath.  Prior to eat last night she had no trouble with fevers and was in her usual state of health.  The patient works as a Arboriculturist but currently is living in her car with her son.  She has an Advair inhaler but the fact that it has steroids and it really scares her so she does not use it.  She is scared of weight gain from steroids.  She does use her albuterol inhaler as needed. Emergency department the patient was treated as routine asthma exacerbation.  She reports that she was breathing better but after some time the ER doctor came back to listen and she was wheezing again.  At 1 point BiPAP was considered for her.  She says this really scared her because it reminded her of what her grandmother was in the hospital with congestive heart failure.  It sounds like her grandmother may have been by BiPap and she died.  (here is a chance her grandmother was actually intubated and the patient does not know the difference because she was young.) Patient did have a CTA of her chest and no PE was found but there was evidence of  bronchitis and mucous plugging. Upon my evaluation the patient says she is breathing much better but she continues to be tachypneic.  She was not wheezing on exam but still had trouble finishing sentences without pausing.    Review of Systems: As mentioned in the history of present illness. All other systems reviewed and are negative. Past Medical History:  Diagnosis Date   Asthma    Hx of ectopic pregnancy 2006   Kidney stones    Migraine    Obesity    Past Surgical History:  Procedure Laterality Date   CARPAL TUNNEL RELEASE Right 01/2018   IR ANGIO INTRA EXTRACRAN SEL COM CAROTID INNOMINATE BILAT MOD SED  07/05/2018   IR ANGIO VERTEBRAL SEL VERTEBRAL BILAT MOD SED  07/05/2018   SALPINGOOPHORECTOMY  2006   pt not sure if her ovaries were removed    Social History:  reports that she has been smoking cigars. She has never used smokeless tobacco. She reports current alcohol use. She reports current drug use. Frequency: 2.00 times per week. Drug: Marijuana.  Allergies  Allergen Reactions   Penicillins Other (See Comments)    Childhood allergy Has patient had a PCN reaction causing immediate rash, facial/tongue/throat swelling, SOB or lightheadedness with hypotension: No Has patient had a PCN reaction causing severe rash involving mucus membranes or skin necrosis: No Has patient had a PCN  reaction that required hospitalization: No Has patient had a PCN reaction occurring within the last 10 years: No If all of the above answers are "NO", then may proceed with Cephalosporin use.     Family History  Problem Relation Age of Onset   Migraines Mother    Aneurysm Father    Congestive Heart Failure Maternal Grandmother    Hypertension Maternal Grandmother    Heart failure Maternal Grandmother    Asthma Neg Hx     Prior to Admission medications   Medication Sig Start Date End Date Taking? Authorizing Provider  predniSONE (DELTASONE) 10 MG tablet Take 4 tablets (40 mg total) by mouth  daily with breakfast for 5 days. 03/26/23 03/31/23 Yes Gowens, Mariah L, PA-C  albuterol (PROVENTIL HFA;VENTOLIN HFA) 108 (90 BASE) MCG/ACT inhaler Inhale 2 puffs into the lungs every 4 (four) hours as needed for wheezing or shortness of breath. For shortness of breath    [provider]  cyclobenzaprine (FLEXERIL) 10 MG tablet Take 1 tablet (10 mg total) by mouth 2 (two) times daily as needed for muscle spasms. 02/17/23   Rising, Lurena Joiner, PA-C  fluticasone-salmeterol (ADVAIR DISKUS) 250-50 MCG/ACT AEPB Inhale 1 puff into the lungs in the morning and at bedtime.    [provider]  furosemide (LASIX) 20 MG tablet Take 1 tablet (20 mg total) by mouth daily. 03/13/23   Gerhard Munch, MD  ibuprofen (ADVIL) 600 MG tablet Take 1 tablet (600 mg total) by mouth 3 (three) times daily. 02/17/23   Rising, Rebecca, PA-C  ondansetron (ZOFRAN) 4 MG tablet Take 1 tablet (4 mg total) by mouth every 6 (six) hours. 02/18/23   Rancour, Jeannett Senior, MD  pregabalin (LYRICA) 50 MG capsule 1 cap(s) orally 2 times a day for 30 day(s) 08/30/17   [provider]  SUMAtriptan (IMITREX) 25 MG tablet 1 tab(s) orally once    [provider]    Physical Exam: Vitals:   03/26/23 1809 03/26/23 1900 03/26/23 2000 03/26/23 2008  BP:  (!) 149/75 137/72 137/72  Pulse:  (!) 109 (!) 107 (!) 110  Resp:    (!) 29  Temp: 98.6 F (37 C)   97.9 F (36.6 C)  TempSrc: Oral   Oral  SpO2:  97% 97% 98%   Physical Exam:  General: No acute distress, well developed, well nourished HEENT: Normocephalic, atraumatic, PERRL Cardiovascular: Normal rhythm, tachy. Distal pulses strong Pulmonary: tachypneic, no wheezing, normal breath sounds Gastrointestinal: Nondistended abdomen, soft, non-tender, normoactive bowel sounds, no organomegaly Musculoskeletal:trace lower ext edema Lymphadenopathy: No cervical LAD. Skin: Skin is warm and dry. Neuro: No focal deficits noted, AAOx3. PSYCH: Attentive and  cooperative  Data Reviewed:  Results for orders placed or performed during the hospital encounter of 03/26/23 (from the past 24 hour(s))  Basic metabolic panel     Status: Abnormal   Collection Time: 03/26/23  9:20 AM  Result Value Ref Range   Sodium 139 135 - 145 mmol/L   Potassium 4.0 3.5 - 5.1 mmol/L   Chloride 109 98 - 111 mmol/L   CO2 23 22 - 32 mmol/L   Glucose, Bld 95 70 - 99 mg/dL   BUN 13 6 - 20 mg/dL   Creatinine, Ser 1.61 0.44 - 1.00 mg/dL   Calcium 8.5 (L) 8.9 - 10.3 mg/dL   GFR, Estimated >09 >60 mL/min   Anion gap 7 5 - 15  CBC with Differential     Status: Abnormal   Collection Time: 03/26/23  9:20 AM  Result Value Ref Range   WBC 5.4 4.0 - 10.5 K/uL   RBC 4.19 3.87 - 5.11 MIL/uL   Hemoglobin 11.3 (L) 12.0 - 15.0 g/dL   HCT 16.1 09.6 - 04.5 %   MCV 86.6 80.0 - 100.0 fL   MCH 27.0 26.0 - 34.0 pg   MCHC 31.1 30.0 - 36.0 g/dL   RDW 40.9 81.1 - 91.4 %   Platelets 241 150 - 400 K/uL   nRBC 0.0 0.0 - 0.2 %   Neutrophils Relative % 70 %   Neutro Abs 3.8 1.7 - 7.7 K/uL   Lymphocytes Relative 18 %   Lymphs Abs 1.0 0.7 - 4.0 K/uL   Monocytes Relative 7 %   Monocytes Absolute 0.4 0.1 - 1.0 K/uL   Eosinophils Relative 4 %   Eosinophils Absolute 0.2 0.0 - 0.5 K/uL   Basophils Relative 1 %   Basophils Absolute 0.0 0.0 - 0.1 K/uL   Immature Granulocytes 0 %   Abs Immature Granulocytes 0.01 0.00 - 0.07 K/uL  Brain natriuretic peptide     Status: None   Collection Time: 03/26/23  9:20 AM  Result Value Ref Range   B Natriuretic Peptide 39.7 0.0 - 100.0 pg/mL  SARS Coronavirus 2 by RT PCR (hospital order, performed in [facility] hospital lab) *cepheid single result test* Anterior Nasal Swab     Status: None   Collection Time: 03/26/23  9:20 AM   Specimen: Anterior Nasal Swab  Result Value Ref Range   SARS Coronavirus 2 by RT PCR NEGATIVE NEGATIVE  Group A Strep by PCR     Status: None   Collection Time: 03/26/23  9:20 AM   Specimen: Anterior Nasal Swab; Sterile Swab   Result Value Ref Range   Group A Strep by PCR NOT DETECTED NOT DETECTED  Magnesium     Status: None   Collection Time: 03/26/23  9:59 AM  Result Value Ref Range   Magnesium 1.9 1.7 - 2.4 mg/dL  I-Stat beta hCG blood, ED     Status: None   Collection Time: 03/26/23  1:31 PM  Result Value Ref Range   I-stat hCG, quantitative <5.0 <5 mIU/mL   Comment 3          I-Stat venous blood gas, ([facility] ED, [facility], [facility])     Status: Abnormal   Collection Time: 03/26/23  1:33 PM  Result Value Ref Range   pH, Ven 7.431 (H) 7.25 - 7.43   pCO2, Ven 31.4 (L) 44 - 60 mmHg   pO2, Ven 61 (H) 32 - 45 mmHg   Bicarbonate 20.9 20.0 - 28.0 mmol/L   TCO2 22 22 - 32 mmol/L   O2 Saturation 92 %   Acid-base deficit 3.0 (H) 0.0 - 2.0 mmol/L   Sodium 140 135 - 145 mmol/L   Potassium 3.0 (L) 3.5 - 5.1 mmol/L   Calcium, Ion 1.06 (L) 1.15 - 1.40 mmol/L   HCT 36.0 36.0 - 46.0 %   Hemoglobin 12.2 12.0 - 15.0 g/dL   Sample type VENOUS   Troponin I (High Sensitivity)     Status: None   Collection Time: 03/26/23  4:13 PM  Result Value Ref Range   Troponin I (High Sensitivity) 12 <18 ng/L     Assessment and Plan: Asthma exacerbation with acute hypoxic respiratory failure -The patient received standard care in the emergency department including nebs, magnesium and IV steroids.  Despite the fact that her wheezing has resolved she continues to require 3  L O2 nasal cannula.  Her O2 sats drop whenever the oxygen is removed, therefore we will admit her for further treatment and monitoring. -  Oral prednisone, nebulizer treatments, continued oxygen, cough syrup and consider antibiotics. Consider chest PT for the mucous plugging. - Check room air abg prior to discharge or at least O2 sats after ambulation to rule out persistent hypoxia.  2.  Ongoing tobacco abuse -the patient was counseled to quit  3.  New homelessness -case management consult.  Patient works in housekeeping.  4.  Mild lower extremity edema -the patient is  petrified that she has congestive heart failure like her grandmother.  She asked about the swelling in her ankles repeatedly.  Will get an echocardiogram.  The patient has seen [facility] and her outpatient notes were reviewed.  Her PFTs actually more consistent with restrictive disease rather than obstruction.  She may even benefit from a sleep study when she is stable.  Compression socks and supportive shoes are recommended also.   Advance Care Planning:   Code Status: Full Code   Consults: None  Family Communication: None  Severity of Illness: The appropriate patient status for this patient is INPATIENT. Inpatient status is judged to be reasonable and necessary in order to provide the required intensity of service to ensure the patient's safety. The patient's presenting symptoms, physical exam findings, and initial radiographic and laboratory data in the context of their chronic comorbidities is felt to place them at high risk for further clinical deterioration. Furthermore, it is not anticipated that the patient will be medically stable for discharge from the hospital within 2 midnights of admission.   * I certify that at the point of admission it is my clinical judgment that the patient will require inpatient hospital care spanning beyond 2 midnights from the point of admission due to high intensity of service, high risk for further deterioration and high frequency of surveillance required.*  Author: Buena Irish, MD 03/26/2023 8:23 PM  For on call review [email or web address].

## 2023-03-26 NOTE — ED Provider Notes (Signed)
Patient is a handoff from  [employer], PA-C. Pending CT PE study for disposition. Patient is hesitant for admission but likely would benefit from admission given hypoxia and new O2 requirement. Physical Exam  BP (!) 162/80   Pulse (!) 107   Temp 98 F (36.7 C) (Oral)   Resp (!) 24   LMP 03/13/2023 (Approximate)   SpO2 100%   Physical Exam Vitals and nursing note reviewed.  Constitutional:      Appearance: She is well-developed.  Pulmonary:     Breath sounds: Wheezing present.  Musculoskeletal:     Right lower leg: Edema present.     Left lower leg: Edema present.  Neurological:     Mental Status: She is alert.     Procedures  Procedures  ED Course / MDM   Clinical Course as of 03/26/23 1601  Sun Mar 26, 2023  1542 O2 Device: Nasal Cannula [OZ]    Clinical Course User Index [OZ] Smitty Knudsen, PA-C   Medical Decision Making Amount and/or Complexity of Data Reviewed Labs: ordered. Radiology: ordered.  Risk Prescription drug management.   Patient is a handoff from [employer], PA-C.  Please see their note for full HPI and physical exam findings.  Plan at time of handoff is to await results of CT PE for disposition.  Patient is likely admission given hypoxia with new O2 requirement in the setting of likely acute asthma exacerbation.  Spoke with Dr. Dimas Chyle who is agreeable to admit patient.       Smitty Knudsen, PA-C 03/26/23 2027    Mardene Sayer, MD 03/27/23 [phone]

## 2023-03-27 ENCOUNTER — Encounter (HOSPITAL_COMMUNITY): Payer: Self-pay | Admitting: Internal Medicine

## 2023-03-27 DIAGNOSIS — J4531 Mild persistent asthma with (acute) exacerbation: Secondary | ICD-10-CM | POA: Diagnosis not present

## 2023-03-27 LAB — TROPONIN I (HIGH SENSITIVITY): Troponin I (High Sensitivity): 11 ng/L (ref <18)

## 2023-03-27 MED ORDER — ORAL CARE MOUTH RINSE: 15.0000 mL | OROMUCOSAL | Status: DC | PRN

## 2023-03-27 MED ORDER — FLUTICASONE-SALMETEROL 115-21 MCG/ACT IN AERO: 2.0000 | INHALATION_SPRAY | Freq: Two times a day (BID) | RESPIRATORY_TRACT | 12 refills | Status: AC

## 2023-03-27 MED ORDER — ALBUTEROL SULFATE HFA 108 (90 BASE) MCG/ACT IN AERS: 2.0000 | INHALATION_SPRAY | RESPIRATORY_TRACT | 3 refills | Status: AC | PRN

## 2023-03-27 NOTE — Progress Notes (Signed)
SATURATION QUALIFICATIONS: (This note is used to comply with regulatory documentation for home oxygen)  Patient Saturations on Room Air at Rest = 92%  Patient Saturations on Room Air while Ambulating = 88%  Patient Saturations on 0 Liters of oxygen while Ambulating = 92%  No oxygen was given, she was not symptomatic.  Please briefly explain why patient needs home oxygen:

## 2023-03-27 NOTE — TOC Initial Note (Signed)
Transition of Care [facility]) - Initial/Assessment Note    Patient Details  Name: Debra Washington MRN: [id number] Date of Birth: 01-31-1981  Transition of Care [facility] CM/SW Contact:    Ralene Bathe, LCSWA Phone Number: 03/27/2023, 12:31 PM  Clinical Narrative:                 LCSW met with patient at bedside.  Patient reports that she is currently living in her car with her son.  The patient parks her car outside of a friend's home and sleeps there.  The patient informed LCSW that she has an appointment tomorrow to housing receive housing.  Patient gave LCSW the number to the agency and permission to contact to reschedule appointment.  The patient reports being employed at the [employer] for the past 8 months.  LCSW inquired about the patient's ability to pay for medications.  The patient reports that she can receive advances on her paycheck to buy food, send car payment, and pay for medications.    LCSW contacted [facility] and spoke with Sherril Cong, [phone]).  LCSW was informed that the patient has an appointment scheduled for tomorrow to complete a housing assessment to be placed on a waiting list and be connected to housing programs in [place].  Ms. Julian Reil will contact the coordinator following the patient, Aggie Cosier, and inquire as to whether the assessment can be completed via phone.  The agency will contact patient and inform of next steps.    TOC following.  Expected Discharge Plan: Homeless Shelter Barriers to Discharge: Continued Medical Work up   Patient Goals and CMS Choice Patient states their goals for this hospitalization and ongoing recovery are:: To make it to the appointment she has with [facility] and her son's cap and gown ceremony          Expected Discharge Plan and Services In-house Referral: Clinical Social Work     Living arrangements for the past 2 months: Homeless (living in car)                                       Prior Living Arrangements/Services Living arrangements for the past 2 months: Homeless (living in car) Lives with:: Self, Adult Children Patient language and need for interpreter reviewed:: Yes Do you feel safe going back to the place where you live?: Yes      Need for Family Participation in Patient Care: No (Comment) Care giver support system in place?: No (comment)   Criminal Activity/Legal Involvement Pertinent to Current Situation/Hospitalization: No - Comment as needed  Activities of Daily Living Home Assistive Devices/Equipment: None ADL Screening (condition at time of admission) Patient's cognitive ability adequate to safely complete daily activities?: Yes Is the patient deaf or have difficulty hearing?: No Does the patient have difficulty seeing, even when wearing glasses/contacts?: No Does the patient have difficulty concentrating, remembering, or making decisions?: No Patient able to express need for assistance with ADLs?: Yes Does the patient have difficulty dressing or bathing?: No Independently performs ADLs?: Yes (appropriate for developmental age) Does the patient have difficulty walking or climbing stairs?: No Weakness of Legs: None Weakness of Arms/Hands: None  Permission Sought/Granted   Permission granted to share information with : Yes, Verbal Permission Granted     Permission granted to share info w AGENCY: [facility]        Emotional Assessment Appearance:: Appears  stated age Attitude/Demeanor/Rapport: Engaged Affect (typically observed): Pleasant, Accepting Orientation: : Oriented to Situation, Oriented to  Time, Oriented to Place, Oriented to Self Alcohol / Substance Use: Not Applicable Psych Involvement: No (comment)  Admission diagnosis:  Hypoxia [R09.02] Viral upper respiratory tract infection [J06.9] Asthma exacerbation, non-allergic, mild persistent [J45.31] Moderate persistent asthma with acute exacerbation  [J45.41] Patient Active Problem List   Diagnosis Date Noted   Asthma exacerbation, non-allergic, mild persistent 03/26/2023   Snoring 10/17/2019   Migraine without aura and without status migrainosus, not intractable 05/02/2018   Vaginitis and vulvovaginitis 03/08/2011   Exposure to viral disease 03/08/2011   Low back pain 03/08/2011   Asthma, intermittent 03/08/2011   TOBACCO ABUSE 12/22/2009   COMMON MIGRAINE 12/22/2009   PCP:  Norm Salt, PA Pharmacy:   [id number] [place], [place] - [address] AT [facility] [address] [place] [address] Phone: [phone] Fax: [phone]     Social Determinants of Health (SDOH) Social History: SDOH Screenings   Food Insecurity: Food Insecurity Present (03/27/2023)  Housing: High Risk (03/27/2023)  Transportation Needs: No Transportation Needs (03/27/2023)  Utilities: At Risk (03/27/2023)  Physical Activity: Unknown (07/05/2018)  Social Connections: Unknown (07/05/2018)  Tobacco Use: High Risk (03/27/2023)   SDOH Interventions: Food Insecurity Interventions: Inpatient TOC Housing Interventions: Inpatient TOC   Readmission Risk Interventions     No data to display

## 2023-03-27 NOTE — Discharge Summary (Signed)
Debra Washington MWU:[id number] DOB: 07/18/1981 DOA: 03/26/2023  PCP: Norm Salt, PA  Admit date: 03/26/2023 Discharge date: 03/27/2023  Time spent: 35 minutes  Recommendations for Outpatient Follow-up:  Close pcp f/u     Discharge Diagnoses:  Principal Problem:   Asthma exacerbation, non-allergic, mild persistent Active Problems:   TOBACCO ABUSE   Discharge Condition: improved  Diet recommendation: regular  Filed Weights   03/26/23 2100  Weight: 90.1 kg    History of present illness:  From admission h and p Debra Washington is a 42 y.o. female with medical history significant for mild asthma Who reports that suddenly last night she started coughing and felt itching in her throat then wheezy and short of breath.  The patient has had mild at many mild asthma exacerbations in the past but never did she gets so short of breath so quickly.  This is the worst exacerbation she has ever had.  Usually she gets antibiotics and cough syrup from her doctor and feels better.  Presented to the emergency room this morning because she could not catch her breath.  Prior to eat last night she had no trouble with fevers and was in her usual state of health.  The patient works as a Arboriculturist but currently is living in her car with her son.  She has an Advair inhaler but the fact that it has steroids and it really scares her so she does not use it.  She is scared of weight gain from steroids.  She does use her albuterol inhaler as needed. Emergency department the patient was treated as routine asthma exacerbation.  She reports that she was breathing better but after some time the ER doctor came back to listen and she was wheezing again.  At 1 point BiPAP was considered for her.  She says this really scared her because it reminded her of what her grandmother was in the hospital with congestive heart failure.  It sounds like her grandmother may have been by BiPap and she died.  (here is a chance her  grandmother was actually intubated and the patient does not know the difference because she was young.) Patient did have a CTA of her chest and no PE was found but there was evidence of bronchitis and mucous plugging. Upon my evaluation the patient says she is breathing much better but she continues to be tachypneic.  She was not wheezing on exam but still had trouble finishing sentences without pausing.    Hospital Course:  Patient presents with asthma exacerbation with acute hypoxic respiratory failure. She was treated with steroids and breathing treatments. Her symptoms improved and she was weaned off oxygen and ambulated without oxygen prior to discharge. CTA negative for PE. Has not been compliant with home meds and is a smoker. Received smoking cessation counseling. Will rx 5 days prednisone, albuterol inhaler, and a controller inhaler (advair). Of note patient currently homeless living in her car. TOC met with patient and provided her with referral to [facility].   Procedures: none   Consultations: none  Discharge Exam: Vitals:   03/27/23 0827 03/27/23 1352  BP: 138/88   Pulse: 100 (!) 101  Resp: 19   Temp: 98.1 F (36.7 C)   SpO2: 99% 98%    General: NAD Cardiovascular: rRR Respiratory: exp wheeze  Discharge Instructions   Discharge Instructions     Diet general   Complete by: As directed    Increase activity slowly   Complete by: As directed  Allergies as of 03/27/2023       Reactions   Furosemide Swelling, Other (See Comments)   Patient feels this might have caused her feet to swell   Penicillins Other (See Comments)   Childhood allergy Has patient had a PCN reaction causing immediate rash, facial/tongue/throat swelling, SOB or lightheadedness with hypotension: No Has patient had a PCN reaction causing severe rash involving mucus membranes or skin necrosis: No Has patient had a PCN reaction that required hospitalization: No Has  patient had a PCN reaction occurring within the last 10 years: No If all of the above answers are "NO", then may proceed with Cephalosporin use.        Medication List     STOP taking these medications    cyclobenzaprine 10 MG tablet Commonly known as: FLEXERIL   furosemide 20 MG tablet Commonly known as: LASIX   ondansetron 4 MG tablet Commonly known as: ZOFRAN       TAKE these medications    albuterol 108 (90 Base) MCG/ACT inhaler Commonly known as: VENTOLIN HFA Inhale 2 puffs into the lungs every 4 (four) hours as needed for wheezing or shortness of breath. For shortness of breath   fluticasone-salmeterol 115-21 MCG/ACT inhaler Commonly known as: Advair HFA Inhale 2 puffs into the lungs 2 (two) times daily.   ibuprofen 800 MG tablet Commonly known as: ADVIL Take 800-1,600 mg by mouth daily as needed (for pain). What changed: Another medication with the same name was removed. Continue taking this medication, and follow the directions you see here.   predniSONE 10 MG tablet Commonly known as: DELTASONE Take 4 tablets (40 mg total) by mouth daily with breakfast for 5 days.       Allergies  Allergen Reactions   Furosemide Swelling and Other (See Comments)    Patient feels this might have caused her feet to swell   Penicillins Other (See Comments)    Childhood allergy Has patient had a PCN reaction causing immediate rash, facial/tongue/throat swelling, SOB or lightheadedness with hypotension: No Has patient had a PCN reaction causing severe rash involving mucus membranes or skin necrosis: No Has patient had a PCN reaction that required hospitalization: No Has patient had a PCN reaction occurring within the last 10 years: No If all of the above answers are "NO", then may proceed with Cephalosporin use.     Follow-up Information     Norm Salt, PA Follow up.   Specialty: Physician Assistant Contact information: [address] [place]  [address] [phone]                  The results of significant diagnostics from this hospitalization (including imaging, microbiology, ancillary and laboratory) are listed below for reference.    Significant Diagnostic Studies: CT Angio Chest PE W and/or Wo Contrast  Result Date: 03/26/2023 CLINICAL DATA:  Cough and acutely worsening shortness of breath EXAM: CT ANGIOGRAPHY CHEST WITH CONTRAST TECHNIQUE: Multidetector CT imaging of the chest was performed using the standard protocol during bolus administration of intravenous contrast. Multiplanar CT image reconstructions and MIPs were obtained to evaluate the vascular anatomy. RADIATION DOSE REDUCTION: This exam was performed according to the departmental dose-optimization program which includes automated exposure control, adjustment of the mA and/or kV according to patient size and/or use of iterative reconstruction technique. CONTRAST:  75mL OMNIPAQUE IOHEXOL 350 MG/ML SOLN COMPARISON:  Chest radiograph dated 03/26/2023 FINDINGS: Cardiovascular: The study is high quality for the evaluation of pulmonary embolism. There are no  filling defects in the central, lobar, segmental or subsegmental pulmonary artery branches to suggest acute pulmonary embolism. Great vessels are normal in course and caliber. Normal heart size. No significant pericardial fluid/thickening. Mediastinum/Nodes: Imaged thyroid gland without nodules meeting criteria for imaging follow-up by size. Normal esophagus. No pathologically enlarged axillary, supraclavicular, mediastinal, or hilar lymph nodes. Lungs/Pleura: The central airways are patent. Diffuse bronchial wall thickening. Subsegmental mucous plugging in the left lower lobe with subsegmental left lower lobe atelectasis. A small degree of right lower lobe subsegmental atelectasis. Otherwise, no focal consolidation. No pneumothorax. No pleural effusion. Upper abdomen: Normal. Musculoskeletal: No acute or abnormal lytic or  blastic osseous lesions. Review of the MIP images confirms the above findings. IMPRESSION: 1. No evidence of pulmonary embolism. 2. Diffuse bronchial wall thickening with subsegmental mucous plugging in the left lower lobe and subsegmental left lower lobe atelectasis. Findings are suggestive of bronchitis. Electronically Signed   By: Agustin Cree M.D.   On: 03/26/2023 16:02   DG Chest 2 View  Result Date: 03/26/2023 CLINICAL DATA:  Progressive shortness of breath. Bilateral lower extremity swelling. EXAM: CHEST - 2 VIEW COMPARISON:  None Available. FINDINGS: The heart size and mediastinal contours are within normal limits. Both lungs are clear. The visualized skeletal structures are unremarkable. IMPRESSION: No active cardiopulmonary disease. Electronically Signed   By: Marin Roberts M.D.   On: 03/26/2023 09:44   VAS [place] LOWER EXTREMITY VENOUS (DVT) (7a-7p)  Result Date: 03/13/2023  Lower Venous DVT Study Patient Name:  JADIEN DEPAUL  Date of Exam:   03/13/2023 Medical Rec #: [id number]        Accession #:    [id number] Date of Birth: 03-18-1981         Patient Gender: F Patient Age:   86 years Exam Location:  [facility] Procedure:      VAS [place] LOWER EXTREMITY VENOUS (DVT) Referring Phys: Molly Maduro LOCKWOOD --------------------------------------------------------------------------------  Indications: Swelling.  Limitations: Pain intolerance. Comparison Study: previous exam on 02/24/20 was negative for DVT Performing Technologist: Ernestene Mention RVT, RDMS  Examination Guidelines: A complete evaluation includes B-mode imaging, spectral Doppler, color Doppler, and power Doppler as needed of all accessible portions of each vessel. Bilateral testing is considered an integral part of a complete examination. Limited examinations for reoccurring indications may be performed as noted. The reflux portion of the exam is performed with the patient in reverse Trendelenburg.   +---------+---------------+---------+-----------+----------+--------------+ RIGHT    CompressibilityPhasicitySpontaneityPropertiesThrombus Aging +---------+---------------+---------+-----------+----------+--------------+ CFV      Full           Yes      Yes                                 +---------+---------------+---------+-----------+----------+--------------+ SFJ      Full                                                        +---------+---------------+---------+-----------+----------+--------------+ FV Prox  Full           Yes      Yes                                 +---------+---------------+---------+-----------+----------+--------------+ FV Mid  Full           Yes      Yes                                 +---------+---------------+---------+-----------+----------+--------------+ FV DistalFull           Yes      Yes                                 +---------+---------------+---------+-----------+----------+--------------+ PFV      Full                                                        +---------+---------------+---------+-----------+----------+--------------+ POP      Full           Yes      Yes                                 +---------+---------------+---------+-----------+----------+--------------+ PTV      Full                                                        +---------+---------------+---------+-----------+----------+--------------+ PERO     Full                                                        +---------+---------------+---------+-----------+----------+--------------+   +----+---------------+---------+-----------+----------+--------------+ LEFTCompressibilityPhasicitySpontaneityPropertiesThrombus Aging +----+---------------+---------+-----------+----------+--------------+ CFV Full           Yes      Yes                                  +----+---------------+---------+-----------+----------+--------------+     Summary: RIGHT: - There is no evidence of deep vein thrombosis in the lower extremity.  - No cystic structure found in the popliteal fossa. subcutaneous edema in area of calf and ankle  LEFT: - No evidence of common femoral vein obstruction.  *See table(s) above for measurements and observations. Electronically signed by Gerarda Fraction on 03/13/2023 at 7:32:06 PM.    Final    DG Foot Complete Right  Result Date: 03/13/2023 CLINICAL DATA:  Right foot pain. EXAM: RIGHT FOOT COMPLETE - 3+ VIEW COMPARISON:  None Available. FINDINGS: There is no evidence of fracture or dislocation. There is no evidence of arthropathy or other focal bone abnormality. Soft tissues swelling about the dorsum of the foot. IMPRESSION: Soft tissue swelling about the dorsum of the foot. No acute fracture or dislocation. Electronically Signed   By: Larose Hires D.O.   On: 03/13/2023 12:12   DG Foot Complete Left  Result Date: 03/13/2023 CLINICAL DATA:  Left foot pain. EXAM: LEFT FOOT - COMPLETE 3+ VIEW COMPARISON:  None Available. FINDINGS: There is no evidence of fracture or dislocation. There is  no evidence of arthropathy or other focal bone abnormality. Soft tissues are swelling about the dorsum of the foot. IMPRESSION: Soft tissue swelling about the dorsum of the foot. No fracture or dislocation. Electronically Signed   By: Larose Hires D.O.   On: 03/13/2023 12:12   DG Ankle 2 Views Right  Result Date: 03/13/2023 CLINICAL DATA:  Right ankle pain EXAM: RIGHT ANKLE - 2 VIEW COMPARISON:  None Available. FINDINGS: There is no evidence of fracture, dislocation, or joint effusion. There is no evidence of arthropathy. Small plantar calcaneal spurring. Soft tissue swelling about the ankle. IMPRESSION: Soft tissue swelling about the ankle. No acute fracture or dislocation. Electronically Signed   By: Larose Hires D.O.   On: 03/13/2023 12:11    Microbiology: Recent  Results (from the past 240 hour(s))  SARS Coronavirus 2 by RT PCR (hospital order, performed in [facility] hospital lab) *cepheid single result test* Anterior Nasal Swab     Status: None   Collection Time: 03/26/23  9:20 AM   Specimen: Anterior Nasal Swab  Result Value Ref Range Status   SARS Coronavirus 2 by RT PCR NEGATIVE NEGATIVE Final    Comment: Performed at [facility], [address] [place], [place] [address]  Group A Strep by PCR     Status: None   Collection Time: 03/26/23  9:20 AM   Specimen: Anterior Nasal Swab; Sterile Swab  Result Value Ref Range Status   Group A Strep by PCR NOT DETECTED NOT DETECTED Final    Comment: Performed at [facility], [address], [place], [place] [address]     Labs: Basic Metabolic Panel: Recent Labs  Lab 03/26/23 0920 03/26/23 0959 03/26/23 1333 03/26/23 2102  NA 139  --  140  --   K 4.0  --  3.0*  --   CL 109  --   --   --   CO2 23  --   --   --   GLUCOSE 95  --   --   --   BUN 13  --   --   --   CREATININE 0.90  --   --  0.88  CALCIUM 8.5*  --   --   --   MG  --  1.9  --   --    Liver Function Tests: No results for input(s): "AST", "ALT", "ALKPHOS", "BILITOT", "PROT", "ALBUMIN" in the last 168 hours. No results for input(s): "LIPASE", "AMYLASE" in the last 168 hours. No results for input(s): "AMMONIA" in the last 168 hours. CBC: Recent Labs  Lab 03/26/23 0920 03/26/23 1333 03/26/23 2102  WBC 5.4  --  8.5  NEUTROABS 3.8  --   --   HGB 11.3* 12.2 10.6*  HCT 36.3 36.0 34.1*  MCV 86.6  --  85.3  PLT 241  --  227   Cardiac Enzymes: No results for input(s): "CKTOTAL", "CKMB", "CKMBINDEX", "TROPONINI" in the last 168 hours. BNP: BNP (last 3 results) Recent Labs    03/13/23 1153 03/26/23 0920  BNP 26.1 39.7    ProBNP (last 3 results) No results for input(s): "PROBNP" in the last 8760 hours.  CBG: No results for input(s): "GLUCAP" in the last 168 hours.     Signed:  Silvano Bilis MD.  [facility] 03/27/2023, 3:50 PM

## 2023-03-27 NOTE — Progress Notes (Addendum)
Discharge instructions reviewed with pt.  Copy of instructions given to pt. Pt informed her scripts were sent to her pharmacy for pick up, expressed the importance of pt completing the prednisone medication to continue to help the asthma inflammation to clear up and encouraged to use the daily inhaler as instructed and the prn albuterol inhaler for SOB or wheezing. Pt states she plans to do so, pt also mentioned her doctor expressed the same thing to her.  Pt d/c'd via wheelchair with belongings, with her son. Pt states her car is in the parking lot and can drive home, pt has not had any sedating medications.    MD provided a note for pt's work, printed and given to pt.       Escorted by staff.    Annice Needy, RN SWOT

## 2023-06-26 ENCOUNTER — Ambulatory Visit (HOSPITAL_COMMUNITY)
Admission: EM | Admit: 2023-06-26 | Discharge: 2023-06-26 | Disposition: A | Discharge status: Home / Self Care | Payer: 59 | Attending: Nurse Practitioner | Admitting: Nurse Practitioner

## 2023-06-26 ENCOUNTER — Encounter (HOSPITAL_COMMUNITY): Payer: Self-pay

## 2023-06-26 DIAGNOSIS — Z20822 Contact with and (suspected) exposure to covid-19: Secondary | ICD-10-CM | POA: Insufficient documentation

## 2023-06-26 NOTE — Discharge Instructions (Signed)
We have tested you today for COVID-19.  You will see the results in Mychart and we will call you with positive results.  Please stay home and isolate until you are aware of the results.    Some things that can make you feel better are: - Increased rest - Increasing fluid with water/sugar free electrolytes - Acetaminophen and ibuprofen as needed for fever/pain - Salt water gargling, chloraseptic spray and throat lozenges - OTC guaifenesin (Mucinex) 600 mg twice daily - Saline sinus flushes or a neti pot - Humidifying the air

## 2023-06-26 NOTE — ED Triage Notes (Addendum)
Patient reports that she was exposed to Covid this weekend, but is not having any symptoms and is wanting a Covid test.  Patient states she is currently in a shelter and needs to be called for resuslts. Patient states she does not have access to a computer.

## 2023-06-26 NOTE — ED Provider Notes (Signed)
[facility]    CSN: [id number] Arrival date & time: 06/26/23  1109      History   Chief Complaint Chief Complaint  Patient presents with   Medication Refill    HPI Debra Washington is a 42 y.o. female.   Patient presents today requesting COVID-19 testing for COVID-19 exposure.  She denies any symptoms including no fever, cough, congestion.  No runny or stuffy nose, shortness of breath, chest pain, chest tightness.  No sore throat, headache, ear pain.  No abdominal pain, nausea/vomiting, or diarrhea.  She is requesting a note for the shelter she is staying at.    Past Medical History:  Diagnosis Date   Asthma    Hx of ectopic pregnancy 2006   Kidney stones    Migraine    Obesity     Patient Active Problem List   Diagnosis Date Noted   Asthma exacerbation, non-allergic, mild persistent 03/26/2023   Snoring 10/17/2019   Migraine without aura and without status migrainosus, not intractable 05/02/2018   Vaginitis and vulvovaginitis 03/08/2011   Exposure to viral disease 03/08/2011   Low back pain 03/08/2011   Asthma, intermittent 03/08/2011   TOBACCO ABUSE 12/22/2009   COMMON MIGRAINE 12/22/2009    Past Surgical History:  Procedure Laterality Date   CARPAL TUNNEL RELEASE Right 01/2018   IR ANGIO INTRA EXTRACRAN SEL COM CAROTID INNOMINATE BILAT MOD SED  07/05/2018   IR ANGIO VERTEBRAL SEL VERTEBRAL BILAT MOD SED  07/05/2018   SALPINGOOPHORECTOMY  2006   pt not sure if her ovaries were removed     OB History   No obstetric history on file.      Home Medications    Prior to Admission medications   Medication Sig Start Date End Date Taking? Authorizing Provider  albuterol (VENTOLIN HFA) 108 (90 Base) MCG/ACT inhaler Inhale 2 puffs into the lungs every 4 (four) hours as needed for wheezing or shortness of breath. For shortness of breath 03/27/23   Wouk, Wilfred Curtis, MD  fluticasone-salmeterol (ADVAIR [facility]) [phone] MCG/ACT inhaler Inhale 2 puffs into the  lungs 2 (two) times daily. 03/27/23   Wouk, Wilfred Curtis, MD  ibuprofen (ADVIL) 800 MG tablet Take 800-1,600 mg by mouth daily as needed (for pain).    [provider]    Family History Family History  Problem Relation Age of Onset   Migraines Mother    Aneurysm Father    Congestive Heart Failure Maternal Grandmother    Hypertension Maternal Grandmother    Heart failure Maternal Grandmother    Asthma Neg Hx     Social History Social History   Tobacco Use   Smoking status: Every Day    Types: Cigars   Smokeless tobacco: Never   Tobacco comments:    3 black and milds a day  Vaping Use   Vaping status: Never Used  Substance Use Topics   Alcohol use: Yes    Comment: occ   Drug use: Yes    Frequency: 2.0 times per week    Types: Marijuana     Allergies   Furosemide and Penicillins   Review of Systems Review of Systems Per HPI  Physical Exam Triage Vital Signs ED Triage Vitals  Encounter Vitals Group     BP 06/26/23 1225 (!) 141/86     Systolic BP Percentile --      Diastolic BP Percentile --      Pulse Rate 06/26/23 1225 79     Resp 06/26/23 1225 16  Temp 06/26/23 1225 98.2 F (36.8 C)     Temp Source 06/26/23 1225 Oral     SpO2 06/26/23 1225 95 %     Weight --      Height --      Head Circumference --      Peak Flow --      Pain Score 06/26/23 1227 0     Pain Loc --      Pain Education --      Exclude from Growth Chart --    No data found.  Updated Vital Signs BP (!) 141/86 (BP Location: Right Arm)   Pulse 79   Temp 98.2 F (36.8 C) (Oral)   Resp 16   LMP 06/26/2023   SpO2 95%   Visual Acuity Right Eye Distance:   Left Eye Distance:   Bilateral Distance:    Right Eye Near:   Left Eye Near:    Bilateral Near:     Physical Exam Vitals and nursing note reviewed.  Constitutional:      General: She is not in acute distress.    Appearance: Normal appearance. She is not ill-appearing or toxic-appearing.  HENT:     Head:  Normocephalic and atraumatic.     Right Ear: Tympanic membrane, ear canal and external ear normal.     Left Ear: Tympanic membrane, ear canal and external ear normal.     Nose: No congestion or rhinorrhea.     Mouth/Throat:     Mouth: Mucous membranes are moist.     Pharynx: Oropharynx is clear. No oropharyngeal exudate or posterior oropharyngeal erythema.  Eyes:     General: No scleral icterus.    Extraocular Movements: Extraocular movements intact.  Cardiovascular:     Rate and Rhythm: Normal rate and regular rhythm.  Pulmonary:     Effort: Pulmonary effort is normal. No respiratory distress.     Breath sounds: Normal breath sounds. No wheezing, rhonchi or rales.  Abdominal:     General: Abdomen is flat. Bowel sounds are normal. There is no distension.     Palpations: Abdomen is soft.  Musculoskeletal:     Cervical back: Normal range of motion and neck supple.  Lymphadenopathy:     Cervical: No cervical adenopathy.  Skin:    General: Skin is warm and dry.     Coloration: Skin is not jaundiced or pale.     Findings: No erythema or rash.  Neurological:     Mental Status: She is alert and oriented to person, place, and time.  Psychiatric:        Behavior: Behavior is cooperative.      UC Treatments / Results  Labs (all labs ordered are listed, but only abnormal results are displayed) Labs Reviewed  SARS CORONAVIRUS 2 (TAT 6-24 HRS)    EKG   Radiology No results found.  Procedures Procedures (including critical care time)  Medications Ordered in UC Medications - No data to display  Initial Impression / Assessment and Plan / UC Course  I have reviewed the triage vital signs and the nursing notes.  Pertinent labs & imaging results that were available during my care of the patient were reviewed by me and considered in my medical decision making (see chart for details).   Patient is well-appearing, normotensive, afebrile, not tachycardic, not tachypneic,  oxygenating well on room air.    1. Exposure to COVID-19 virus Patient is asymptomatic today COVID-19 testing obtained Supportive care discussed if symptoms develop Note given  for shelter   The patient was given the opportunity to ask questions.  All questions answered to their satisfaction.  The patient is in agreement to this plan.    Final Clinical Impressions(s) / UC Diagnoses   Final diagnoses:  Exposure to COVID-19 virus     Discharge Instructions      We have tested you today for COVID-19.  You will see the results in Mychart and we will call you with positive results.  Please stay home and isolate until you are aware of the results.    Some things that can make you feel better are: - Increased rest - Increasing fluid with water/sugar free electrolytes - Acetaminophen and ibuprofen as needed for fever/pain - Salt water gargling, chloraseptic spray and throat lozenges - OTC guaifenesin (Mucinex) 600 mg twice daily - Saline sinus flushes or a neti pot - Humidifying the air    ED Prescriptions   None    PDMP not reviewed this encounter.   Valentino Nose, NP 06/26/23 1345

## 2023-06-27 LAB — SARS CORONAVIRUS 2 (TAT 6-24 HRS): SARS Coronavirus 2: NEGATIVE

## 2023-10-27 ENCOUNTER — Other Ambulatory Visit: Payer: Self-pay

## 2023-10-27 ENCOUNTER — Emergency Department (HOSPITAL_COMMUNITY): Payer: Medicaid Other

## 2023-10-27 ENCOUNTER — Encounter (HOSPITAL_COMMUNITY): Payer: Self-pay | Admitting: *Deleted

## 2023-10-27 ENCOUNTER — Emergency Department (HOSPITAL_COMMUNITY)
Admission: EM | Admit: 2023-10-27 | Discharge: 2023-10-27 | Disposition: A | Discharge status: Home / Self Care | Payer: Medicaid Other | Attending: Emergency Medicine | Admitting: Emergency Medicine

## 2023-10-27 DIAGNOSIS — M545 Low back pain, unspecified: Secondary | ICD-10-CM | POA: Diagnosis present

## 2023-10-27 DIAGNOSIS — M5442 Lumbago with sciatica, left side: Secondary | ICD-10-CM | POA: Diagnosis not present

## 2023-10-27 MED ORDER — METHOCARBAMOL 500 MG PO TABS: 500.0000 mg | ORAL_TABLET | Freq: Two times a day (BID) | ORAL | 0 refills | Status: DC

## 2023-10-27 MED ORDER — HYDROCODONE-ACETAMINOPHEN 5-325 MG PO TABS
1.0000 | ORAL_TABLET | Freq: Once | ORAL | Status: AC
  Administered 2023-10-27: 1 via ORAL
  Filled 2023-10-27: qty 1

## 2023-10-27 MED ORDER — HYDROCODONE-ACETAMINOPHEN 5-325 MG PO TABS: 1.0000 | ORAL_TABLET | ORAL | 0 refills | Status: DC | PRN

## 2023-10-27 MED ORDER — IBUPROFEN 400 MG PO TABS
600.0000 mg | ORAL_TABLET | Freq: Once | ORAL | Status: AC
  Administered 2023-10-27: 600 mg via ORAL
  Filled 2023-10-27: qty 1

## 2023-10-27 MED ORDER — IBUPROFEN 600 MG PO TABS: 600.0000 mg | ORAL_TABLET | Freq: Four times a day (QID) | ORAL | 0 refills | Status: DC | PRN

## 2023-10-27 MED ORDER — METHOCARBAMOL 500 MG PO TABS
750.0000 mg | ORAL_TABLET | Freq: Once | ORAL | Status: AC
  Administered 2023-10-27: 750 mg via ORAL
  Filled 2023-10-27: qty 2

## 2023-10-27 NOTE — ED Triage Notes (Signed)
The pt is c/o lower back pain since yesterday lmp now  no known injury

## 2023-10-27 NOTE — Discharge Instructions (Addendum)
Take the medications provided as prescribed. While at home, rest the back muscles by lying down, up to the bathroom only, until your pain eases.   If having to go to work, take the ibuprofen only, and then take the Robaxin and Norco when at home as these medications can cause drowsiness.

## 2023-10-27 NOTE — ED Provider Triage Note (Signed)
Emergency Medicine Provider Triage Evaluation Note  Debra Washington , a 42 y.o. female  was evaluated in triage.  Pt complains of low back pain. States that same began initially a few days ago and worsened today. States that she is now having difficulty walking due to pain. No history of similar. No trauma or overuse. No loss of bowel or bladder function or saddle paresthesias. Notes that she is also on her menstrual cycle. Does endorse some dysuria. Pain does not radiate into her legs  Review of Systems  Positive:  Negative:   Physical Exam  BP 134/78 (BP Location: Left Arm)   Pulse 75   Temp 97.6 F (36.4 C)   Resp 18   Ht 5\' 1"  (1.549 m)   Wt 90.1 kg   LMP 10/27/2023   SpO2 100%   BMI 37.53 kg/m  Gen:   Awake, no distress  Resp:  Normal effort  MSK:   Moves extremities without difficulty  Other:  Left sided low back TTP, now overlying skin changes.  Medical Decision Making  Medically screening exam initiated at 4:05 PM.  Appropriate orders placed.  Debra Washington was informed that the remainder of the evaluation will be completed by another provider, this initial triage assessment does not replace that evaluation, and the importance of remaining in the ED until their evaluation is complete.  Work-up initiated   [name] 10/27/23 1607

## 2023-10-27 NOTE — ED Provider Notes (Signed)
[place] EMERGENCY DEPARTMENT AT [facility] Provider Note   CSN: [id number] Arrival date & time: 10/27/23  1434     History  Chief Complaint  Patient presents with   Back Pain    Debra Washington is a 42 y.o. female.  Patient to ED with c/o severe low back pain that started several days ago without known injury or strain. The pain has gradually become worse over time and today reports there is pain with walking. No weakness or falls. No bowel/bladder dysfunction. No abdominal pain, fever, nausea.   The history is provided by the patient. No language interpreter was used.  Back Pain      Home Medications Prior to Admission medications   Medication Sig Start Date End Date Taking? Authorizing Provider  HYDROcodone-acetaminophen (NORCO/VICODIN) 5-325 MG tablet Take 1 tablet by mouth every 4 (four) hours as needed. 10/27/23  Yes Madison Albea, Melvenia Beam, PA-C  ibuprofen (ADVIL) 600 MG tablet Take 1 tablet (600 mg total) by mouth every 6 (six) hours as needed. 10/27/23  Yes Nazaret Chea, Melvenia Beam, PA-C  methocarbamol (ROBAXIN) 500 MG tablet Take 1 tablet (500 mg total) by mouth 2 (two) times daily. 10/27/23  Yes Breslin Hemann, Melvenia Beam, PA-C  albuterol (VENTOLIN HFA) 108 (90 Base) MCG/ACT inhaler Inhale 2 puffs into the lungs every 4 (four) hours as needed for wheezing or shortness of breath. For shortness of breath 03/27/23   Wouk, Wilfred Curtis, MD  fluticasone-salmeterol (ADVAIR [facility]) [phone] MCG/ACT inhaler Inhale 2 puffs into the lungs 2 (two) times daily. 03/27/23   Wouk, Wilfred Curtis, MD      Allergies    Furosemide and Penicillins    Review of Systems   Review of Systems  Musculoskeletal:  Positive for back pain.    Physical Exam Updated Vital Signs BP (!) 158/81   Pulse 68   Temp 98.6 F (37 C) (Oral)   Resp 16   Ht 5\' 1"  (1.549 m)   Wt 90.1 kg   LMP 10/27/2023   SpO2 100%   BMI 37.53 kg/m  Physical Exam Vitals and nursing note reviewed.  Constitutional:      Appearance:  She is well-developed.  HENT:     Head: Normocephalic.  Cardiovascular:     Rate and Rhythm: Normal rate.  Pulmonary:     Effort: Pulmonary effort is normal.  Abdominal:     General: Bowel sounds are normal.     Palpations: Abdomen is soft.     Tenderness: There is no abdominal tenderness. There is no guarding or rebound.  Musculoskeletal:        General: Normal range of motion.     Cervical back: Normal range of motion and neck supple.     Comments: Mild lumbar and paralumbar tenderness without swelling or discoloration.  Skin:    General: Skin is warm and dry.     Findings: No rash.  Neurological:     General: No focal deficit present.     Mental Status: She is alert and oriented to person, place, and time.     Sensory: No sensory deficit.     Coordination: Coordination normal.     Gait: Gait normal.     Deep Tendon Reflexes: Reflexes are normal and symmetric. Reflexes normal.     ED Results / Procedures / Treatments   Labs (all labs ordered are listed, but only abnormal results are displayed) Labs Reviewed - No data to display   EKG None  Radiology No results found.  Procedures Procedures    Medications Ordered in ED Medications  HYDROcodone-acetaminophen (NORCO/VICODIN) 5-325 MG per tablet 1 tablet (1 tablet Oral Given 10/27/23 2059)  ibuprofen (ADVIL) tablet 600 mg (600 mg Oral Given 10/27/23 2059)  methocarbamol (ROBAXIN) tablet 750 mg (750 mg Oral Given 10/27/23 2058)    ED Course/ Medical Decision Making/ A&P Clinical Course as of 11/03/23 0434  Fri Oct 27, 2023  2133 Patient with low back pain without neurologic red flags on exam. Ambulatory to the bathroom observed. Pain addressed. Will treat conservatively with medications, rest, PCP follow up. Return precautions discussed.  [SU]    Clinical Course User Index [SU] Elpidio Anis, PA-C                                 Medical Decision Making Risk Prescription drug  management.           Final Clinical Impression(s) / ED Diagnoses Final diagnoses:  Acute left-sided low back pain without sciatica    Rx / DC Orders ED Discharge Orders          Ordered    HYDROcodone-acetaminophen (NORCO/VICODIN) 5-325 MG tablet  Every 4 hours PRN        10/27/23 2145    ibuprofen (ADVIL) 600 MG tablet  Every 6 hours PRN        10/27/23 2145    methocarbamol (ROBAXIN) 500 MG tablet  2 times daily        10/27/23 2145              Elpidio Anis, PA-C 11/03/23 0434    Terrilee Files, MD 11/03/23 1048

## 2024-07-04 ENCOUNTER — Encounter (HOSPITAL_BASED_OUTPATIENT_CLINIC_OR_DEPARTMENT_OTHER): Payer: Self-pay

## 2024-07-04 ENCOUNTER — Observation Stay (HOSPITAL_BASED_OUTPATIENT_CLINIC_OR_DEPARTMENT_OTHER)
Admission: EM | Admit: 2024-07-04 | Discharge: 2024-07-05 | Disposition: A | Discharge status: Home / Self Care | Attending: Family Medicine | Admitting: Family Medicine

## 2024-07-04 ENCOUNTER — Emergency Department (HOSPITAL_BASED_OUTPATIENT_CLINIC_OR_DEPARTMENT_OTHER)

## 2024-07-04 ENCOUNTER — Other Ambulatory Visit: Payer: Self-pay

## 2024-07-04 DIAGNOSIS — Z79899 Other long term (current) drug therapy: Secondary | ICD-10-CM | POA: Diagnosis not present

## 2024-07-04 DIAGNOSIS — Z6838 Body mass index (BMI) 38.0-38.9, adult: Secondary | ICD-10-CM | POA: Diagnosis not present

## 2024-07-04 DIAGNOSIS — F1090 Alcohol use, unspecified, uncomplicated: Secondary | ICD-10-CM | POA: Diagnosis not present

## 2024-07-04 DIAGNOSIS — F17210 Nicotine dependence, cigarettes, uncomplicated: Secondary | ICD-10-CM | POA: Insufficient documentation

## 2024-07-04 DIAGNOSIS — Z86711 Personal history of pulmonary embolism: Principal | ICD-10-CM

## 2024-07-04 DIAGNOSIS — J45909 Unspecified asthma, uncomplicated: Secondary | ICD-10-CM | POA: Insufficient documentation

## 2024-07-04 DIAGNOSIS — F1290 Cannabis use, unspecified, uncomplicated: Secondary | ICD-10-CM | POA: Insufficient documentation

## 2024-07-04 DIAGNOSIS — E66812 Obesity, class 2: Secondary | ICD-10-CM | POA: Insufficient documentation

## 2024-07-04 DIAGNOSIS — Z1152 Encounter for screening for COVID-19: Secondary | ICD-10-CM | POA: Insufficient documentation

## 2024-07-04 DIAGNOSIS — R0602 Shortness of breath: Secondary | ICD-10-CM | POA: Diagnosis present

## 2024-07-04 DIAGNOSIS — I10 Essential (primary) hypertension: Secondary | ICD-10-CM | POA: Diagnosis not present

## 2024-07-04 DIAGNOSIS — I2699 Other pulmonary embolism without acute cor pulmonale: Principal | ICD-10-CM | POA: Insufficient documentation

## 2024-07-04 LAB — URINE DRUG SCREEN
Amphetamines: NEGATIVE
Barbiturates: NEGATIVE
Benzodiazepines: NEGATIVE
Cocaine: NEGATIVE
Fentanyl: NEGATIVE
Methadone Scn, Ur: NEGATIVE
Opiates: NEGATIVE
Tetrahydrocannabinol: POSITIVE — AB

## 2024-07-04 LAB — URINALYSIS, ROUTINE W REFLEX MICROSCOPIC
Bilirubin Urine: NEGATIVE
Glucose, UA: NEGATIVE mg/dL
Ketones, ur: NEGATIVE mg/dL
Leukocytes,Ua: NEGATIVE
Nitrite: NEGATIVE
Protein, ur: NEGATIVE mg/dL
Specific Gravity, Urine: 1.02 (ref 1.005–1.030)
pH: 7 (ref 5.0–8.0)

## 2024-07-04 LAB — HIV ANTIBODY (ROUTINE TESTING W REFLEX): HIV Screen 4th Generation wRfx: NONREACTIVE

## 2024-07-04 LAB — CBC WITH DIFFERENTIAL/PLATELET
Abs Immature Granulocytes: 0.01 K/uL (ref 0.00–0.07)
Basophils Absolute: 0 K/uL (ref 0.0–0.1)
Basophils Relative: 0 %
Eosinophils Absolute: 0.4 K/uL (ref 0.0–0.5)
Eosinophils Relative: 4 %
HCT: 36 % (ref 36.0–46.0)
Hemoglobin: 11.8 g/dL — ABNORMAL LOW (ref 12.0–15.0)
Immature Granulocytes: 0 %
Lymphocytes Relative: 21 %
Lymphs Abs: 1.7 K/uL (ref 0.7–4.0)
MCH: 27.5 pg (ref 26.0–34.0)
MCHC: 32.8 g/dL (ref 30.0–36.0)
MCV: 83.9 fL (ref 80.0–100.0)
Monocytes Absolute: 0.7 K/uL (ref 0.1–1.0)
Monocytes Relative: 9 %
Neutro Abs: 5.2 K/uL (ref 1.7–7.7)
Neutrophils Relative %: 66 %
Platelets: 193 K/uL (ref 150–400)
RBC: 4.29 MIL/uL (ref 3.87–5.11)
RDW: 13.5 % (ref 11.5–15.5)
WBC: 8 K/uL (ref 4.0–10.5)
nRBC: 0 % (ref 0.0–0.2)

## 2024-07-04 LAB — BASIC METABOLIC PANEL WITH GFR
Anion gap: 10 (ref 5–15)
BUN: 14 mg/dL (ref 6–20)
CO2: 24 mmol/L (ref 22–32)
Calcium: 8.9 mg/dL (ref 8.9–10.3)
Chloride: 107 mmol/L (ref 98–111)
Creatinine, Ser: 0.78 mg/dL (ref 0.44–1.00)
GFR, Estimated: >60 mL/min (ref >60)
Glucose, Bld: 100 mg/dL — ABNORMAL HIGH (ref 70–99)
Potassium: 3.5 mmol/L (ref 3.5–5.1)
Sodium: 141 mmol/L (ref 135–145)

## 2024-07-04 LAB — APTT: aPTT: 29 s (ref 24–36)

## 2024-07-04 LAB — D-DIMER, QUANTITATIVE: D-Dimer, Quant: 2.65 ug{FEU}/mL — ABNORMAL HIGH (ref 0.00–0.50)

## 2024-07-04 LAB — URINALYSIS, MICROSCOPIC (REFLEX)

## 2024-07-04 LAB — PREGNANCY, URINE: Preg Test, Ur: NEGATIVE

## 2024-07-04 LAB — LACTIC ACID, PLASMA: Lactic Acid, Venous: 0.6 mmol/L (ref 0.5–1.9)

## 2024-07-04 LAB — PROTIME-INR
INR: 1.1 (ref 0.8–1.2)
Prothrombin Time: 14.5 s (ref 11.4–15.2)

## 2024-07-04 LAB — PRO BRAIN NATRIURETIC PEPTIDE: Pro Brain Natriuretic Peptide: 55.8 pg/mL (ref <300.0)

## 2024-07-04 LAB — TROPONIN T, HIGH SENSITIVITY: Troponin T High Sensitivity: <15 ng/L (ref 0–19)

## 2024-07-04 LAB — HEPARIN LEVEL (UNFRACTIONATED): Heparin Unfractionated: 0.85 [IU]/mL — ABNORMAL HIGH (ref 0.30–0.70)

## 2024-07-04 LAB — RESP PANEL BY RT-PCR (RSV, FLU A&B, COVID)  RVPGX2
Influenza A by PCR: NEGATIVE
Influenza B by PCR: NEGATIVE
Resp Syncytial Virus by PCR: NEGATIVE
SARS Coronavirus 2 by RT PCR: NEGATIVE

## 2024-07-04 MED ORDER — SODIUM CHLORIDE 0.9% FLUSH
3.0000 mL | Freq: Two times a day (BID) | INTRAVENOUS | Status: DC
  Administered 2024-07-04 – 2024-07-05 (×2): 3 mL via INTRAVENOUS

## 2024-07-04 MED ORDER — APIXABAN 2.5 MG PO TABS: 5.0000 mg | ORAL_TABLET | Freq: Two times a day (BID) | ORAL | Status: DC

## 2024-07-04 MED ORDER — APIXABAN 5 MG PO TABS: 5.0000 mg | ORAL_TABLET | Freq: Two times a day (BID) | ORAL | Status: DC

## 2024-07-04 MED ORDER — IPRATROPIUM-ALBUTEROL 0.5-2.5 (3) MG/3ML IN SOLN
9.0000 mL | Freq: Once | RESPIRATORY_TRACT | Status: AC
  Administered 2024-07-04: 9 mL via RESPIRATORY_TRACT
  Filled 2024-07-04: qty 9

## 2024-07-04 MED ORDER — APIXABAN 2.5 MG PO TABS: 10.0000 mg | ORAL_TABLET | Freq: Two times a day (BID) | ORAL | Status: DC

## 2024-07-04 MED ORDER — HEPARIN (PORCINE) 25000 UT/250ML-% IV SOLN
1100.0000 [IU]/h | INTRAVENOUS | Status: AC
  Administered 2024-07-04: 1250 [IU]/h via INTRAVENOUS
  Administered 2024-07-04: 1100 [IU]/h via INTRAVENOUS
  Filled 2024-07-04 (×2): qty 250

## 2024-07-04 MED ORDER — FLUTICASONE FUROATE-VILANTEROL 200-25 MCG/ACT IN AEPB
1.0000 | INHALATION_SPRAY | Freq: Every day | RESPIRATORY_TRACT | Status: DC
  Administered 2024-07-05: 1 via RESPIRATORY_TRACT
  Filled 2024-07-04: qty 28

## 2024-07-04 MED ORDER — METHOCARBAMOL 500 MG PO TABS
500.0000 mg | ORAL_TABLET | Freq: Two times a day (BID) | ORAL | Status: DC
  Administered 2024-07-04 – 2024-07-05 (×2): 500 mg via ORAL
  Filled 2024-07-04 (×2): qty 1

## 2024-07-04 MED ORDER — ALBUTEROL SULFATE HFA 108 (90 BASE) MCG/ACT IN AERS: 2.0000 | INHALATION_SPRAY | Freq: Four times a day (QID) | RESPIRATORY_TRACT | Status: DC | PRN

## 2024-07-04 MED ORDER — METHYLPREDNISOLONE SODIUM SUCC 125 MG IJ SOLR
125.0000 mg | Freq: Once | INTRAMUSCULAR | Status: AC
  Administered 2024-07-04: 125 mg via INTRAVENOUS
  Filled 2024-07-04: qty 2

## 2024-07-04 MED ORDER — HYDROMORPHONE HCL 1 MG/ML IJ SOLN: 0.5000 mg | INTRAMUSCULAR | Status: DC | PRN

## 2024-07-04 MED ORDER — APIXABAN (ELIQUIS) EDUCATION KIT FOR DVT/PE PATIENTS
PACK | Freq: Once | Status: AC
  Filled 2024-07-04: qty 1

## 2024-07-04 MED ORDER — APIXABAN 5 MG PO TABS
10.0000 mg | ORAL_TABLET | Freq: Two times a day (BID) | ORAL | Status: DC
  Administered 2024-07-05: 10 mg via ORAL
  Filled 2024-07-04: qty 2

## 2024-07-04 MED ORDER — IOHEXOL 350 MG/ML SOLN
75.0000 mL | Freq: Once | INTRAVENOUS | Status: AC | PRN
  Administered 2024-07-04: 75 mL via INTRAVENOUS

## 2024-07-04 MED ORDER — ALBUTEROL SULFATE (2.5 MG/3ML) 0.083% IN NEBU: 2.5000 mg | INHALATION_SOLUTION | Freq: Four times a day (QID) | RESPIRATORY_TRACT | Status: DC | PRN

## 2024-07-04 MED ORDER — HYDROCODONE-ACETAMINOPHEN 5-325 MG PO TABS: 1.0000 | ORAL_TABLET | ORAL | Status: DC | PRN

## 2024-07-04 MED ORDER — NICOTINE 14 MG/24HR TD PT24: 14.0000 mg | MEDICATED_PATCH | Freq: Every day | TRANSDERMAL | Status: DC | PRN

## 2024-07-04 MED ORDER — ALBUTEROL SULFATE (2.5 MG/3ML) 0.083% IN NEBU: 3.0000 mL | INHALATION_SOLUTION | RESPIRATORY_TRACT | Status: DC | PRN

## 2024-07-04 MED ORDER — HEPARIN BOLUS VIA INFUSION
5000.0000 [IU] | Freq: Once | INTRAVENOUS | Status: AC
  Administered 2024-07-04: 5000 [IU] via INTRAVENOUS

## 2024-07-04 MED ORDER — MAGNESIUM SULFATE 2 GM/50ML IV SOLN
2.0000 g | Freq: Once | INTRAVENOUS | Status: AC
  Administered 2024-07-04: 2 g via INTRAVENOUS
  Filled 2024-07-04: qty 50

## 2024-07-04 NOTE — ED Provider Notes (Signed)
 [place] EMERGENCY DEPARTMENT AT [facility] Provider Note   CSN: [id number] Arrival date & time: 07/04/24  0340     History Chief Complaint  Patient presents with   Shortness of Breath   chest wall pain    HPI Debra Washington is a 43 y.o. female presenting for chief complaint of shortness of breath. She is a 43 year old female with a expansive medical history including asthma and homelessness.  She states that she has been living in her car and tonight she had sudden onset worsening shortness of breath.  Progressive over the day today.  Patient's recorded medical, surgical, social, medication list and allergies were reviewed in the Snapshot window as part of the initial history.   Review of Systems   Review of Systems  Constitutional:  Negative for chills and fever.  HENT:  Negative for ear pain and sore throat.   Eyes:  Negative for pain and visual disturbance.  Respiratory:  Positive for chest tightness and shortness of breath. Negative for cough.   Cardiovascular:  Negative for chest pain and palpitations.  Gastrointestinal:  Negative for abdominal pain and vomiting.  Genitourinary:  Negative for dysuria and hematuria.  Musculoskeletal:  Negative for arthralgias and back pain.  Skin:  Negative for color change and rash.  Neurological:  Negative for seizures and syncope.  All other systems reviewed and are negative.   Physical Exam Updated Vital Signs BP (!) 143/77   Pulse (!) 102   Temp 98.7 F (37.1 C) (Oral)   Resp (!) 21   Ht 5' 1 (1.549 m)   Wt 91.6 kg   LMP 06/10/2024 (Exact Date)   SpO2 (!) 89%   BMI 38.17 kg/m  Physical Exam Vitals and nursing note reviewed.  Constitutional:      General: She is not in acute distress.    Appearance: She is well-developed.  HENT:     Head: Normocephalic and atraumatic.  Eyes:     Conjunctiva/sclera: Conjunctivae normal.  Cardiovascular:     Rate and Rhythm: Normal rate and regular rhythm.     Heart  sounds: No murmur heard. Pulmonary:     Effort: Pulmonary effort is normal. No respiratory distress.     Breath sounds: Decreased breath sounds present.  Abdominal:     General: There is no distension.     Palpations: Abdomen is soft.     Tenderness: There is no abdominal tenderness. There is no right CVA tenderness or left CVA tenderness.  Musculoskeletal:        General: No swelling or tenderness. Normal range of motion.     Cervical back: Neck supple.  Skin:    General: Skin is warm and dry.  Neurological:     General: No focal deficit present.     Mental Status: She is alert and oriented to person, place, and time. Mental status is at baseline.     Cranial Nerves: No cranial nerve deficit.      ED Course/ Medical Decision Making/ A&P    Procedures .Critical Care  Performed by: Jerral Meth, MD Authorized by: Jerral Meth, MD   Critical care provider statement:    Critical care time (minutes):  45   Critical care was necessary to treat or prevent imminent or life-threatening deterioration of the following conditions:  Respiratory failure and circulatory failure   Critical care was time spent personally by me on the following activities:  Development of treatment plan with patient or surrogate, discussions with consultants, evaluation  of patient's response to treatment, examination of patient, ordering and review of laboratory studies, ordering and review of radiographic studies, ordering and performing treatments and interventions, pulse oximetry, re-evaluation of patient's condition and review of old charts    Medications Ordered in ED Medications  apixaban  (ELIQUIS ) tablet 10 mg (has no administration in time range)    Followed by  apixaban  (ELIQUIS ) tablet 5 mg (has no administration in time range)  ipratropium-albuterol  (DUONEB) 0.5-2.5 (3) MG/3ML nebulizer solution 9 mL (9 mLs Nebulization Given 07/04/24 0403)  methylPREDNISolone  sodium succinate  (SOLU-MEDROL ) 125 mg/2 mL injection 125 mg (125 mg Intravenous Given 07/04/24 0415)  magnesium  sulfate IVPB 2 g 50 mL (0 g Intravenous Stopped 07/04/24 0537)  iohexol  (OMNIPAQUE ) 350 MG/ML injection 75 mL (75 mLs Intravenous Contrast Given 07/04/24 0517)   Medical Decision Making:   Debra Washington is a 43 y.o. female with a history of asthma, who presented to the ED today with acute SOB. Relatively acute onset in the setting of living out of her vehicle this year. On my initial exam, the pt was SOB and tachypneic. Audible wheezing and grossly decreased breath sounds appreciated.  Reviewed and confirmed nursing documentation for past medical history, family history, social history.    Initial Assessment:   With the patient's presentation of SOB in the above setting, most likely diagnosis is Asthma Exacerbation. Other diagnoses were considered including (but not limited to) CAP, PE, ACS, viral infection, PTX. These are considered less likely due to history of present illness and physical exam findings.   This is most consistent with an acute life/limb threatening illness complicated by underlying chronic conditions.  Initial Plan:  Empiric treatment of patient's symptoms with immediate initiation of inhaled bronchodilators and IV steroids. Given advanced nature of patient's presentation, will proceed with IV magnesium  as a rescue therapy.   Evaluation for ACS with EKG and serial troponin Evaluation for infectious versus intrathoracic abnormality with chest x-ray  Screening labs including CBC and Metabolic panel to evaluate for infectious or metabolic etiology of disease.  Given unilateral leg pain, patient's Geneva score is elevated for will evaluate with D-dimer and CTA if possible Objective evaluation as below reviewed   Initial Study Results:   Laboratory  All laboratory results reviewed without evidence of clinically relevant pathology.     EKG EKG was reviewed independently. Rate, rhythm,  axis, intervals all examined and without medically relevant abnormality. ST segments without concerns for elevations.    Radiology:  All images reviewed independently. Agree with radiology report at this time.   CT Angio Chest Pulmonary Embolism (PE) W or WO Contrast Result Date: 07/04/2024 EXAM: CTA CHEST 07/04/2024 05:25:15 AM TECHNIQUE: CTA of the chest was performed after the administration of intravenous contrast. Multiplanar reformatted images are provided for review. MIP images are provided for review. Automated exposure control, iterative reconstruction, and/or weight based adjustment of the mA/kV was utilized to reduce the radiation dose to as low as reasonably achievable. COMPARISON: CT angiogram of the chest dated 03/26/2023. CLINICAL HISTORY: Pulmonary embolism (PE) suspected, low to intermediate prob, positive D-dimer. SOB and sharp pain under left breast after eating boneless chicken wings at midnight. Hx asthma. FINDINGS: PULMONARY ARTERIES: Pulmonary embolic disease present bilaterally, seen within the distal left pulmonary artery and within the posterior apical branch of the left upper lobe. Pulmonary embolus also present within the right lower lobe pulmonary arterial tree involving the medial basal artery and the superior segmental artery. There is also thromboembolus within the anterior segmental  branch of the right upper lobe. The main pulmonary artery is normal in caliber. MEDIASTINUM: The heart and pericardium demonstrate no acute abnormality. There is no right ventricular strain. There is no acute abnormality of the thoracic aorta. LYMPH NODES: No mediastinal, hilar or axillary lymphadenopathy. LUNGS AND PLEURA: Mild atelectasis present dependently within the left lower lobe. No focal consolidation or pulmonary edema. No evidence of pleural effusion or pneumothorax. UPPER ABDOMEN: Limited images of the upper abdomen are unremarkable. SOFT TISSUES AND BONES: No acute bone or soft tissue  abnormality. IMPRESSION: 1. Pulmonary embolic disease involving the distal left pulmonary artery, posterior apical branch of the left upper lobe, right lower lobe pulmonary arterial tree (medial basal and superior segmental arteries), and anterior segmental branch of the right upper lobe. 2. No right ventricular strain. Main pulmonary artery is normal in caliber. 3. Mild dependent atelectasis in the left lower lobe. Electronically signed by: Evalene Coho MD 07/04/2024 05:55 AM EDT RP Workstation: [id number]   DG Chest Portable 1 View Result Date: 07/04/2024 EXAM: 1 VIEW XRAY OF THE CHEST 07/04/2024 04:00:00 AM COMPARISON: PA and lateral radiographs of the chest dated 03/26/2023. CLINICAL HISTORY: Chest pain. SOB; chest wall pain. Hx asthma. FINDINGS: LUNGS AND PLEURA: Subtle hazy opacification of the right medial lung base. No pulmonary edema. No pleural effusion. No pneumothorax. HEART AND MEDIASTINUM: No acute abnormality of the cardiac and mediastinal silhouettes. BONES AND SOFT TISSUES: No acute osseous abnormality. IMPRESSION: 1. Subtle hazy opacification of the right medial lung base. Electronically signed by: Evalene Coho MD 07/04/2024 04:24 AM EDT RP Workstation: [id number]      Consults: Case discussed with hospitalist.   Final Assessment and Plan:   After initiation of medical therapies, patient is grossly improved and no longer in acute distress.   However, studies positive for bilateral pulmonary embolism.  No right heart strain.  Patient is tachypneic and short of breath on my exam.  Believe she will require stabilization.  Complicating her care is the fact that she is homeless and does not have access to medical care.  Will admit to the hospital for titration onto anticoagulation and ensure that she has anticoagulation in hand and has access to medications with social work  Disposition:   Based on the above findings, I believe this patient is stable for admission.     Patient/family educated about specific findings on our evaluation and explained exact reasons for admission.  Patient/family educated about clinical situation and time was allowed to answer questions.   Admission team communicated with and agreed with need for admission. Patient admitted. Patient ready to move at this time.     Emergency Department Medication Summary:   Medications  apixaban  (ELIQUIS ) tablet 10 mg (has no administration in time range)    Followed by  apixaban  (ELIQUIS ) tablet 5 mg (has no administration in time range)  ipratropium-albuterol  (DUONEB) 0.5-2.5 (3) MG/3ML nebulizer solution 9 mL (9 mLs Nebulization Given 07/04/24 0403)  methylPREDNISolone  sodium succinate (SOLU-MEDROL ) 125 mg/2 mL injection 125 mg (125 mg Intravenous Given 07/04/24 0415)  magnesium  sulfate IVPB 2 g 50 mL (0 g Intravenous Stopped 07/04/24 0537)  iohexol  (OMNIPAQUE ) 350 MG/ML injection 75 mL (75 mLs Intravenous Contrast Given 07/04/24 0517)          Clinical Impression:  1. History of pulmonary embolus (PE)      Data Unavailable    Clinical Impression:  1. History of pulmonary embolus (PE)      Data Unavailable   Final Clinical  Impression(s) / ED Diagnoses Final diagnoses:  History of pulmonary embolus (PE)    Rx / DC Orders ED Discharge Orders     None         Jerral Meth, MD 07/04/24 [id number]

## 2024-07-04 NOTE — Progress Notes (Signed)
 PHARMACY - ANTICOAGULATION CONSULT NOTE  Pharmacy Consult for heparin  Indication: pulmonary embolus  Allergies  Allergen Reactions   Penicillins Other (See Comments)    Childhood allergy Has patient had a PCN reaction causing immediate rash, facial/tongue/throat swelling, SOB or lightheadedness with hypotension: No Has patient had a PCN reaction causing severe rash involving mucus membranes or skin necrosis: No Has patient had a PCN reaction that required hospitalization: No Has patient had a PCN reaction occurring within the last 10 years: No If all of the above answers are NO, then may proceed with Cephalosporin use.     Patient Measurements: Height: 5' 1 (154.9 cm) Weight: 92.1 kg (203 lb 0.7 oz) IBW/kg (Calculated) : 47.8 HEPARIN  DW (KG): 69.5  Vital Signs: Temp: 97.6 F (36.4 C) (08/28 1610) Temp Source: Oral (08/28 1610) BP: 157/86 (08/28 1610) Pulse Rate: 84 (08/28 1610)  Labs: Recent Labs    07/04/24 0416 07/04/24 0617 07/04/24 1611  HGB 11.8*  --   --   HCT 36.0  --   --   PLT 193  --   --   APTT  --  29  --   LABPROT  --  14.5  --   INR  --  1.1  --   HEPARINUNFRC  --   --  0.85*  CREATININE 0.78  --   --     Estimated Creatinine Clearance: 92.8 mL/min (by C-G formula based on SCr of 0.78 mg/dL).    Assessment: 44 yoF presented to ED with SOB and chest pain. Pharmacy consulted to dose heparin  for acute PE on CTA.  Initial heparin  level supra-therapeutic at 0.85 units/mL.  Lab drawn appropriately and no bleeding observed per discussion with RN  Goal of Therapy:  Heparin  level 0.3-0.7 units/ml Monitor platelets by anticoagulation protocol: Yes   Plan:  Reduce IV heparin  to 1100 units/hr Check 6 hr heparin  level Transition to Eliquis  in AM (orders entered)  Dolce Sylvia D. Lendell, PharmD, BCPS, BCCCP 07/04/2024, 5:39 PM

## 2024-07-04 NOTE — ED Triage Notes (Signed)
 Pt states after she ate last night (boneless chicken wings) from [employer] approx 12 MN right after she ate became [facility] and having pain under left breast.  Shallow rapid breathing noted

## 2024-07-04 NOTE — H&P (Signed)
 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-had been provided in the past with resources Asthma with hospitalization in the past in 2020 for severe asthma Previous ectopic pregnancy status post ex lap bilateral salpingectomy and lysis of adhesions in 2006  Developed chest pain 8/27 after eating some chicken wings from [place]- Initially was thought that she might have a asthma exacerbation or some severe heartburn   Workup ultimately revealed pulmonary embolism involving distal left pulmonary artery posterior apical branch of the left upper right lower and anterior segmental branch of RUL no ventricular strain main pulmonary artery is normal dependent atelectasis left lower lobe Rest of labs on admission were relatively normal other than mild anemia proBNP was 55 lactic acid was 0.6 No recent car travel no hormone replacement therapy does have history of salpingitis?  In the past as well as ectopic surgery in 2006 Family history is not really significant for any type of blood malignancy-unfortunately mother was killed when patient was 32 years old does not know much about dad and other relatives have heart failure   Review of Systems:  As mentioned above in HPI are pertinent +'s Pertinent negatives as per below   ED Course: Started on heparin  given pain meds   Past Medical History:  Diagnosis Date   Asthma    Hx of ectopic pregnancy 2006   Kidney stones    Migraine    Obesity    Past Surgical History:  Procedure Laterality Date   CARPAL TUNNEL RELEASE Right 01/2018   IR ANGIO INTRA EXTRACRAN SEL COM CAROTID INNOMINATE BILAT MOD SED  07/05/2018   IR ANGIO VERTEBRAL SEL VERTEBRAL BILAT MOD SED  07/05/2018   SALPINGOOPHORECTOMY  2006   pt  not sure if her ovaries were removed     reports that she has been smoking cigars. She has never used smokeless tobacco. She reports current alcohol use. She reports current drug use. Drug: Marijuana.  Mobility: Independent  Allergies  Allergen Reactions   Penicillins Other (See Comments)    Childhood allergy Has patient had a PCN reaction causing immediate rash, facial/tongue/throat swelling, SOB or lightheadedness with hypotension: No Has patient had a PCN reaction causing severe rash involving mucus membranes or skin necrosis: No Has patient had a PCN reaction that required hospitalization: No Has patient had a PCN reaction occurring within the last 10 years: No If all of the above answers are NO, then may proceed with Cephalosporin use.    Family History  Problem Relation Age of Onset   Migraines Mother    Aneurysm Father    Congestive Heart Failure Maternal Grandmother    Hypertension Maternal Grandmother    Heart failure Maternal Grandmother    Asthma Neg Hx    Prior to Admission medications   Medication Sig Start Date End Date Taking? Authorizing Provider  albuterol  (VENTOLIN  HFA) 108 (90 Base) MCG/ACT inhaler Inhale 2 puffs into the lungs every 4 (four) hours as needed for wheezing or shortness of breath. For shortness of breath 03/27/23   Wouk, Devaughn Sayres, MD  fluticasone -salmeterol (ADVAIR HFA) 115-21 MCG/ACT inhaler Inhale 2 puffs into the lungs 2 (two) times daily. 03/27/23   Wouk, Devaughn Sayres, MD  HYDROcodone -acetaminophen  (NORCO/VICODIN) 5-325 MG tablet Take 1 tablet by mouth  every 4 (four) hours as needed. 10/27/23   Odell Balls, PA-C  ibuprofen  (ADVIL ) 600 MG tablet Take 1 tablet (600 mg total) by mouth every 6 (six) hours as needed. 10/27/23   Odell Balls, PA-C  methocarbamol  (ROBAXIN ) 500 MG tablet Take 1 tablet (500 mg total) by mouth 2 (two) times daily. 10/27/23   Odell Balls, PA-C    Physical Exam:  Vitals:   07/04/24 1200 07/04/24 1215  BP:  (!) 145/90   Pulse: 85   Resp: (!) 24 (!) 24  Temp:    SpO2: 91%     Slightly anxious appearing black female BMI >35 slightly thick neck good dentition No thyromegaly lobe lymphadenopathy Chest is clear no wheeze rales rhonchi S1-S2 sinus rhythm Abdomen soft no rebound no guarding Slightly swollen lower extremities no cords or tenderness power 5/5  I have personally reviewed following labs and imaging studies  Labs:  Sodium 141 potassium 3.5 BUN/creatinine 14/0.7 WBC 8 hemoglobin 11.8 platelet 193  Imaging studies:  CT angio = pulmonary embolic disease distal left pulmonary artery posterior apical branch left upper lobe right lower lobe pulmonary arterial tree no ventricular strange main pulmonary arteries normal caliber dependent atelectasis LL lobe  Medical tests:  EKG independently reviewed: Normal sinus rhythm PR interval 0.12 QRS axis 70 degrees sinus tach noted with some rate related ST segment depressions RSR prime V1 meets borderline criteria for LVH  Test discussed with performing physician: No  Decision to obtain old records:  Yes  Review and summation of old records:  Yes  Principal Problem:   Acute pulmonary embolism (HCC)   Assessment/Plan Acute pulmonary emboli Heparin  gtt. transition rapidly to DOAC-will use Eliquis -she does have Medicaid and pays 4$ at dollars for her prescription confirmed by [facility] so we will probably get this filled here locally once she completes about 24 hours of ivanticoagulation Complete workup with duplex lower extremities echocardiogram although I do not think that her PE is severe she is a little breathless and may this may just be her underlying anxiety  History hypertension tells me the past was diagnosed with this but does not take any medication Blood pressures are slightly elevated I am not sure if she has a PCP We will probably start amlodipine  10 mg and see how she does on that She will need primary care  Asthma diagnosed  as an adult resume albuterol  fluticasone  will need some of these at discharge  Daily smoker smokes 3 black and milds daily--cessation counseling recommended  Class II obesity BMI 38 Needs outpatient strategies for weight loss once stabilized will need PCP TOC consulted    Severity of Illness: The appropriate patient status for this patient is OBSERVATION. Observation status is judged to be reasonable and necessary in order to provide the required intensity of service to ensure the patient's safety. The patient's presenting symptoms, physical exam findings, and initial radiographic and laboratory data in the context of their medical condition is felt to place them at decreased risk for further clinical deterioration. Furthermore, it is anticipated that the patient will be medically stable for discharge from the hospital within 2 midnights of admission.    Family Communication: Patient alone  DVT ppx: On IV heparin  Consults called & Whom: None  Time spent: 50 minutes  Royal, MD dufm my NP partners at night for Care related issues] [facility] --[employer]ia [employer] OR , [email or web address]; password [facility]  07/04/2024, 4:10 PM

## 2024-07-04 NOTE — Progress Notes (Signed)
 PHARMACY - ANTICOAGULATION CONSULT NOTE  Pharmacy Consult for heparin  Indication: pulmonary embolus  Allergies  Allergen Reactions   Penicillins Other (See Comments)    Childhood allergy Has patient had a PCN reaction causing immediate rash, facial/tongue/throat swelling, SOB or lightheadedness with hypotension: No Has patient had a PCN reaction causing severe rash involving mucus membranes or skin necrosis: No Has patient had a PCN reaction that required hospitalization: No Has patient had a PCN reaction occurring within the last 10 years: No If all of the above answers are NO, then may proceed with Cephalosporin use.     Patient Measurements: Height: 5' 1 (154.9 cm) Weight: 91.6 kg (202 lb) IBW/kg (Calculated) : 47.8 HEPARIN  DW (KG): 69.3  Vital Signs: Temp: 98.7 F (37.1 C) (08/28 0420) Temp Source: Oral (08/28 0420) BP: 143/77 (08/28 0600) Pulse Rate: 102 (08/28 0600)  Labs: Recent Labs    07/04/24 0416 07/04/24 0617  HGB 11.8*  --   HCT 36.0  --   PLT 193  --   APTT  --  29  LABPROT  --  14.5  INR  --  1.1  CREATININE 0.78  --     Estimated Creatinine Clearance: 92.5 mL/min (by C-G formula based on SCr of 0.78 mg/dL).   Medical History: Past Medical History:  Diagnosis Date   Asthma    Hx of ectopic pregnancy 2006   Kidney stones    Migraine    Obesity     Assessment: 15 yoF presented to ED with SOB and chest pain. Pharmacy consulted to dose heparin  for PE.  -CBC shows Hgb 11.8, plts 193 -No prior to admission anticoagulation reported -CTa: bilateral pulmonary embolism; no RHS  Goal of Therapy:  Heparin  level 0.3-0.7 units/ml Monitor platelets by anticoagulation protocol: Yes   Plan:  Give 5000 units bolus x 1 Start heparin  infusion at 1250 units/hr Check anti-Xa level in 6 hours and daily while on heparin  Continue to monitor H&H and platelets  Lynwood Poplar, PharmD, BCPS Clinical Pharmacist 07/04/2024 6:44 AM

## 2024-07-04 NOTE — ED Notes (Signed)
 Pt  reports she ate some boneless chicken wings at 12 MN and soon after developed SHOB and sharp pain under left breast. States it hurts to take a deep breath. Pt is homeless and sleeps in her car at [employer]

## 2024-07-04 NOTE — ED Notes (Signed)
 Called [facility] for Transport to [facility] @14 :01.  Spoke with [employer].

## 2024-07-04 NOTE — ED Notes (Signed)
 Patient transported to CT

## 2024-07-04 NOTE — ED Notes (Signed)
 Pt awakened r/t sats dropping into the upper 80's, pt states she does have sleep apnea. Pt placed on 2L/[place]

## 2024-07-04 NOTE — Discharge Instructions (Addendum)
[facility] assistance programs. If you are behind on your bills and expenses, and need some help to make it through a short term hardship or financial emergency, there are several organizations and charities in the [place] and [place] area that may be able to help. They range from the [employer], [employer], Landscape architect of [employer] and the local community action agency, the [employer], [employer]. These groups may be able to provide you resources to help pay your utility bills, rent, and they even offer housing assistance.  Crisis assistance program Find help for paying your rent, electric bills, free food, and even funds to pay your mortgage. The [employer] ([phone] [phone]) offers several services to local families, as funding allows. The Emergency Assistance Program (EAP), which they administer, provides household goods, free food, clothing, and financial aid to people in need in the [facility] area. The EAP program does have some qualification, and counselors will interview clients for financial assistance by written referral only. Referrals need to be made by the Department of Social Services or by other EAP approved human services agencies or charities in the area.  Money for resources for emergency assistance are available for security deposits for rent, water, electric, and gas, past due rent, utility bills, past due mortgage payments, food, and clothing. The [employer] also operates a Programme researcher, broadcasting/film/video on the site. More [employer].  [employer] of [employer], which can be reached at [phone], offers emergency assistance programs for those in need of help, such as food, rent assistance, a soup kitchen, shelter, and clothing. They are based in [facility] but provide a number of services to those that qualify for assistance. Continue with [employer]ries  programs.  [facility] Department of Social Services may be able to offer temporary financial assistance and cash grants for paying rent and utilities. Help may be provided for local county residents who may be experiencing personal crisis when other resources, including government programs, are not available. Call [phone]            [employer] [place] [employer], which is based in [place], provides financial assistance of up to $50.00 to help pay for rent, utilities, cooling bills, rent, and prescription medications. The program also provides secondhand furniture to those in need. [phone]  [employer] is a Geneticist, molecular. The organization can offer emergency assistance for paying rent, electric bills, utilities, food, household products and furniture. They offer extensive emergency and transitional housing for families, children and single women, and also run a Boy's and Dole Food. [address] [address], [employer], and other aid offered too. [address] [place], [place] [address], [phone]  Additional locations of the [employer] are in [place] and other nearby communities. When you have an emergency, need free food, money for basic needs, or just need assistance around Christmas, then the [employer] may have the resources you need. Or they can refer you to nearby agencies. Learn more.  [place] Low Income Risk manager - This is offered for [facility] families. The federal government created [employer] Program provides a one-time cash grant payment to help eligible low-income families pay their electric and heating bills. [address] [place], [place] [address], [phone]  Government and [employer] - The county administers several emergency and self-sufficiency programs. Residents of [place]  can get help with energy bills and food, rent, and other  expenses. In addition, work with a Sports coach who may be able to help you find a job or improve your employment skills. More [place] public assistance.  [place] Emergency Assistance - A program offers emergency utility and rent funds for greater [place] area residents. The program can also provide counseling and referrals to charities and government programs. Also provides food and a free meal program that serves lunch Mondays - Saturdays and dinner seven days per week to individuals in the community. [address] [place], [place] [address], [phone]  [employer] - Offers affordable apartment and housing communities across [place] and [place]. The low income and seniors can access public housing, rental assistance to qualified applicants, and apply for the section 8 rent subsidy program. Other programs include Chiropractor and Engineer, maintenance. [address] [place], [place] [address], dial [phone].  Basic needs such as clothing - Low income families can receive free items (school supplies, clothes, holiday assistance, etc.) from clothing closets while more moderate income [address] families can shop at [employer]. Locations across the area help the needy. Get information on [place] [facility] free clothing centers.  The [facility] provides transitional housing to veterans and the disabled. Clients will also access other services too, including life skills classes, case management, and assistance in finding permanent housing. [address], [place], [place] [address], call [phone]  [facility] Transitional Housing in [place] is for people who were just evicted or that are formerly homeless. The non-profit will also help then gain self-sufficiency, find a home or apartment to live in, and also provides information on rent assistance when needed.  Dial [phone]  [employer] Partnership to End Homelessness is available in [place]. Families that were evicted or that are homeless can gain shelter, food, clothing, furniture, and also emergency financial assistance. Other services include financial skills and life skills coaching, job training, and case management. [address], [place], [place] [address]. Telephone [phone].  The [employer], [employer]. runs the [employer]. This can help people save money on their heating and summer cooling bills, and is free to low income families. Free upgrades can be made to your home. Phone [phone]  Many of the non-profits and programs mentioned above are all inclusive, meaning they can meet many needs of the low income, such as energy bills, food, rent, and more. However there are several organizations that focus just on rent and housing. Read more on rent assistance in [place] region.  Legal assistance for evictions, foreclosure, and more If you need free legal advice on civili issues, such as foreclosures, evictions, Electronics engineer, government programs, domestic issues and more, Armed forces operational officer Aid of [facility]) is a Associate Professor firm that provides free legal services and counsel to lower income people, seniors, disabled, and others. The goal is to ensure everyone has access to justice and fair representation.  Call them at ([phone], or click here to learn more about [place] free legal assistance programs.  [place] [employer] and funds for emergency expenses The [employer] is another organization that can provide people with [employer] and funds to pay bills. Their assistance depends on funding, and the demand for help is always very high. They can provide cash to help pay rent, a missed mortgage payment, or gas, electric, and water bills. But the  assistance doesn't stop there. They also have a food pantry on site, which can  provide food once every three (3) months to people who need help. The [employer] can also offer a Engineering geologist once every three (3) months for a maximum three (3) times. After receiving this voucher over that period of time, applicants can receive this aid one every six (6) months after that. [phone].  K[employer]'s action agency The [employer], [employer]. offers job and Dispensing optician. Resources are focused on helping students obtain the skills and experiences that are necessary to compete in today's challenging and tight job market. The non-profit faith-based community action agency offers internship trainings as well as classroom instruction. Economically disadvantaged and challenged individuals and potential employers can use their services. Classes are tailored to meet the needs of people in the [facility] region. [place], [place] [address], [phone]               Foreclosure prevention services Housing Counseling and Education is also offered by [employer] of the [place]. The agency (phone number is below) is a Engineer, structural providing foreclosure advice and counseling. They offer mortgage resolution counseling and also reverse mortgage counseling. Counselors can direct people to both [employer]ark, as well as [employer] foreclosure assistance options.  Warehouse manager has locations in [place] and [place]. They run debt and foreclosure prevention programs for local families. A sampling of the programs offered include both Budget and Housing Counseling. This includes money management, financial advice, budget review and development of a written action plan with a Pensions consultant to help solve specific individual financial problems. In addition, housing and mortgage counselors can also provide pre-  and post-purchase homeownership counseling, default resolution counseling (to prevent foreclosure) and reverse mortgage counseling. A Debt Management Program allows people and families with a high level of credit card or medical debt to consolidate and repay consumer debt and loans to creditors and rebuild positive credit ratings and scores. [phone]  Debt assistance programs Receive free counseling and debt help from [facility] of the [place]. The [facility] based agency can be reached at [phone]. The counselors provide free help, and the services include budget counseling. This will help people manage their expenses and set goals. They also offer a Forensic scientist, which will help individuals consolidate their debts and become debt free. Most of the workshops and services are free.  Community clinics in [place] Five of the leading health and dental centers are listed below. They may be able to provide medication, physicals, dental care, and general family care to residents of all incomes and backgrounds across the region. Some of the programs focus on the low income and underinsured. However if these clinics can't meet your needs, find information and details on more clinics in [facility].  Some of the options include [employer] of [employer]. This center provides free or low cost health care to low-income adults 18 - 64, who have no health insurance. Among other services offered include a pharmacy and eye clinic. Phone [phone]  [facility], which is located in [place], is a community clinic that provides primary medical and health care to uninsured and underinsured adults and families, as well as the low income, in the greater [place] area on a sliding-fee scale. Call [phone]  [place] [facility] -  They run a dental assistance program that is organized by [employer] [facility] to provide  dental services and aid to [place] residents. Services offered by the dental clinic are limited to extractions, pain management, and minor restorative care. [phone]  [place] [facility] has locations in [place] and [place]. The community clinics provide complete pediatric care including primary health, mental health, social work, neurology, cardiology, asthma. Dial [phone].  In addition to those [address] [place] and [facility] [place] community clinics, find other free community clinics in [place] and across the county.  Food pantry and assistance Some of the local food pantries and distribution centers to call for free food and groceries include The Hive of [place] (phone [phone]), The [facility] (phone [phone]) and also [employer] Corporation[employer] Assistance Incorporated. Dial [phone]) [phone].  Several other food banks in the region provide clothing, free food and meals, access to soup kitchens and other help. Find the addresses and phone numbers of more food pantries in [place]. [email or web address]

## 2024-07-04 NOTE — Plan of Care (Signed)
 Plan of Care Note for accepted transfer   Patient name: Debra Washington FMW:[id number] DOB: 04-26-1981  Facility [facility] transfer[facility] ED Requesting Provider: Dr. Jerral Facility course: 43 year old female with history of asthma, homelessness presented to ED with acute onset shortness of breath and chest pain.  Tachycardic and hypoxic to the upper 80s on room air.  Placed on supplemental oxygen.  No leukocytosis, troponin negative, D-dimer positive, UDS positive for THC, COVID/influenza/RSV PCR negative, proBNP pending, PT/INR/APTT pending, lactic acid pending.  CTA chest showing: IMPRESSION: 1. Pulmonary embolic disease involving the distal left pulmonary artery, posterior apical branch of the left upper lobe, right lower lobe pulmonary arterial tree (medial basal and superior segmental arteries), and anterior segmental branch of the right upper lobe. 2. No right ventricular strain. Main pulmonary artery is normal in caliber. 3. Mild dependent atelectasis in the left lower lobe.  Patient was given DuoNeb, Solu-Medrol , IV mag, and started on IV heparin .  Plan of care: The patient is accepted for admission to Progressive unit at [facility].  [facility] will assume care on arrival to accepting facility. Until arrival, care as per EDP. However, [facility] available 24/7 for questions and assistance.  Check [email or web address] for on-call coverage.  Nursing staff, please call [facility] Admits & Consults System-Wide number under Amion on patient's arrival so appropriate admitting provider can evaluate the pt.

## 2024-07-04 NOTE — TOC Progression Note (Signed)
 Transition of Care [facility]) - Progression Note    Patient Details  Name: Debra Washington MRN: [id number] Date of Birth: [birth year]  Transition of Care [facility]) CM/SW Contact  Waddell Barnie Rama, RN Phone Number: 07/04/2024, 4:35 PM  Clinical Narrative:     Patient states she pays 4.00 for her prescriptions.                    Expected Discharge Plan and Services                                               Social Drivers of Health (SDOH) Interventions SDOH Screenings   Food Insecurity: Food Insecurity Present (03/27/2023)  Housing: High Risk (03/27/2023)  Transportation Needs: No Transportation Needs (03/27/2023)  Utilities: At Risk (03/27/2023)  Physical Activity: Unknown (07/05/2018)  Social Connections: Unknown (07/05/2018)  Tobacco Use: High Risk (07/04/2024)    Readmission Risk Interventions     No data to display

## 2024-07-05 ENCOUNTER — Other Ambulatory Visit (HOSPITAL_COMMUNITY): Payer: Self-pay

## 2024-07-05 ENCOUNTER — Observation Stay (HOSPITAL_COMMUNITY)

## 2024-07-05 ENCOUNTER — Telehealth (HOSPITAL_COMMUNITY): Payer: Self-pay

## 2024-07-05 DIAGNOSIS — M7989 Other specified soft tissue disorders: Secondary | ICD-10-CM | POA: Diagnosis not present

## 2024-07-05 DIAGNOSIS — I2609 Other pulmonary embolism with acute cor pulmonale: Secondary | ICD-10-CM

## 2024-07-05 DIAGNOSIS — I2699 Other pulmonary embolism without acute cor pulmonale: Secondary | ICD-10-CM | POA: Diagnosis not present

## 2024-07-05 LAB — ECHOCARDIOGRAM COMPLETE
Area-P 1/2: 7.37 cm2
Height: 61 in
S' Lateral: 2.7 cm
Weight: 3248.7 [oz_av]

## 2024-07-05 LAB — COMPREHENSIVE METABOLIC PANEL WITH GFR
ALT: 15 U/L (ref 0–44)
AST: 13 U/L — ABNORMAL LOW (ref 15–41)
Albumin: 2.6 g/dL — ABNORMAL LOW (ref 3.5–5.0)
Alkaline Phosphatase: 43 U/L (ref 38–126)
Anion gap: 9 (ref 5–15)
BUN: 12 mg/dL (ref 6–20)
CO2: 21 mmol/L — ABNORMAL LOW (ref 22–32)
Calcium: 8.5 mg/dL — ABNORMAL LOW (ref 8.9–10.3)
Chloride: 109 mmol/L (ref 98–111)
Creatinine, Ser: 0.79 mg/dL (ref 0.44–1.00)
GFR, Estimated: >60 mL/min (ref >60)
Glucose, Bld: 126 mg/dL — ABNORMAL HIGH (ref 70–99)
Potassium: 4.3 mmol/L (ref 3.5–5.1)
Sodium: 139 mmol/L (ref 135–145)
Total Bilirubin: 0.4 mg/dL (ref 0.0–1.2)
Total Protein: 6 g/dL — ABNORMAL LOW (ref 6.5–8.1)

## 2024-07-05 LAB — HEPARIN LEVEL (UNFRACTIONATED): Heparin Unfractionated: 0.49 [IU]/mL (ref 0.30–0.70)

## 2024-07-05 LAB — CBC
HCT: 34.5 % — ABNORMAL LOW (ref 36.0–46.0)
Hemoglobin: 11.3 g/dL — ABNORMAL LOW (ref 12.0–15.0)
MCH: 27.2 pg (ref 26.0–34.0)
MCHC: 32.8 g/dL (ref 30.0–36.0)
MCV: 82.9 fL (ref 80.0–100.0)
Platelets: 201 K/uL (ref 150–400)
RBC: 4.16 MIL/uL (ref 3.87–5.11)
RDW: 13.2 % (ref 11.5–15.5)
WBC: 11.7 K/uL — ABNORMAL HIGH (ref 4.0–10.5)
nRBC: 0 % (ref 0.0–0.2)

## 2024-07-05 MED ORDER — AMLODIPINE BESYLATE 10 MG PO TABS
10.0000 mg | ORAL_TABLET | Freq: Every day | ORAL | 11 refills | Status: AC
  Filled 2024-07-05: qty 30, 30d supply, fill #0

## 2024-07-05 MED ORDER — NICOTINE 14 MG/24HR TD PT24: 14.0000 mg | MEDICATED_PATCH | Freq: Every day | TRANSDERMAL | 0 refills | Status: AC | PRN

## 2024-07-05 MED ORDER — APIXABAN 5 MG PO TABS: ORAL_TABLET | ORAL | 0 refills | Status: AC

## 2024-07-05 NOTE — TOC CM/SW Note (Signed)
 Transition of Care [facility]) - Inpatient Brief Assessment   Patient Details  Name: Debra Washington MRN: [id number] Date of Birth: 1981/05/23  Transition of Care [facility]) CM/SW Contact:    Waddell Barnie Rama, RN Phone Number: 07/05/2024, 9:59 AM   Clinical Narrative: Homeless, stays in her car, she still works, has PCP and insurance on file, states has no HH services in place at this time or DME.  States she will transport herself  at dc.  Has no  support system, states gets medications from [facility] on [address].  Pta self ambulatory.   CSW to give housing resources.   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

## 2024-07-05 NOTE — Plan of Care (Signed)
  Problem: Education: Goal: Knowledge of General Education information will improve Description: Including pain rating scale, medication(s)/side effects and non-pharmacologic comfort measures Outcome: Progressing   Problem: Clinical Measurements: Goal: Will remain free from infection Outcome: Progressing   Problem: Clinical Measurements: Goal: Diagnostic test results will improve Outcome: Progressing   Problem: Clinical Measurements: Goal: Respiratory complications will improve Outcome: Progressing   Problem: Clinical Measurements: Goal: Cardiovascular complication will be avoided Outcome: Progressing   Problem: Activity: Goal: Risk for activity intolerance will decrease Outcome: Progressing   Problem: Skin Integrity: Goal: Risk for impaired skin integrity will decrease Outcome: Progressing

## 2024-07-05 NOTE — TOC Progression Note (Signed)
 Transition of Care [facility]) - Progression Note    Patient Details  Name: Debra Washington MRN: [id number] Date of Birth: Mar 08, 1981  [facility]) CM/SW Contact  Luise JAYSON Pan, LCSWA Phone Number: 07/05/2024, 10:27 AM  Clinical Narrative:   CSW met patient at bedside to provided community resources to address SDOH needs. CSW placed resources on patients AVS as well.                        Expected Discharge Plan and Services                                               Social Drivers of Health (SDOH) Interventions SDOH Screenings   Food Insecurity: Food Insecurity Present (03/27/2023)  Housing: High Risk (03/27/2023)  Transportation Needs: No Transportation Needs (03/27/2023)  Utilities: At Risk (03/27/2023)  Physical Activity: Unknown (07/05/2018)  Social Connections: Unknown (07/05/2018)  Tobacco Use: High Risk (07/04/2024)    Readmission Risk Interventions    07/05/2024    9:58 AM  Readmission Risk Prevention Plan  Post Dischage Appt Complete  Medication Screening Complete  Transportation Screening Complete

## 2024-07-05 NOTE — TOC Transition Note (Addendum)
 Transition of Care [facility]) - Discharge Note   Patient Details  Name: Debra Washington MRN: [id number] Date of Birth: 27-Apr-1981  Transition of Care [facility]) CM/SW Contact:  Waddell Barnie Rama, RN Phone Number: 07/05/2024, 10:02 AM   Clinical Narrative:    For possible dc today, she will be on eliquis , her copay is 4.00.  patient still works.  NCM gave patient 30 day free trial coupon for eliquis .         Patient Goals and CMS Choice            Discharge Placement                       Discharge Plan and Services Additional resources added to the After Visit Summary for                                       Social Drivers of Health (SDOH) Interventions SDOH Screenings   Food Insecurity: Food Insecurity Present (03/27/2023)  Housing: High Risk (03/27/2023)  Transportation Needs: No Transportation Needs (03/27/2023)  Utilities: At Risk (03/27/2023)  Physical Activity: Unknown (07/05/2018)  Social Connections: Unknown (07/05/2018)  Tobacco Use: High Risk (07/04/2024)     Readmission Risk Interventions    07/05/2024    9:58 AM  Readmission Risk Prevention Plan  Post Dischage Appt Complete  Medication Screening Complete  Transportation Screening Complete

## 2024-07-05 NOTE — Discharge Summary (Addendum)
 Physician Discharge Summary  Debra Washington FMW:[id number] DOB: 1981-07-03 DOA: 07/04/2024  PCP: [name], PA  Admit date: 07/04/2024 Discharge date: 07/05/2024  Time spent: 45 minutes  Recommendations for Outpatient Follow-up:  Needs anticoagulation at least for 1 year given large DVTs and burden Recommend outpatient follow-up with labs in about 1 week or so Encourage smoking cessation  Discharge Diagnoses:  MAIN problem for hospitalization   Acute pulmonary embolism with DVTs bilaterally  Please see below for itemized issues addressed in HOpsital- refer to other progress notes for clarity if needed  Discharge Condition: Improved  Diet recommendation: Heart healthy  Filed Weights   07/04/24 0350 07/04/24 1610  Weight: 91.6 kg 92.1 kg    History of present illness:  43 year old female-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-had been provided in the past with resources Asthma with hospitalization in the past in 2020 for severe asthma Previous ectopic pregnancy status post ex lap bilateral salpingectomy and lysis of adhesions in 2006   Developed chest pain 8/27 after eating some chicken wings from [place]- Initially was thought that she might have a asthma exacerbation or some severe heartburn     Workup ultimately revealed pulmonary embolism involving distal left pulmonary artery posterior apical branch of the left upper right lower and anterior segmental branch of RUL no ventricular strain main pulmonary artery is normal dependent atelectasis left lower lobe Rest of labs on admission were relatively normal other than mild anemia proBNP was 55 lactic acid was 0.6 No recent car travel no hormone replacement therapy does have history of salpingitis?  In the past as well as ectopic surgery in 2006  She was admitted to the hospital and then transition rapidly from IV heparin  to Eliquis  and TOC confirmed 4 dollar availability of DOAC and  we chose Eliquis  as she works-duplex as above showed burden of clot show she will need at least a year of therapy Echo did not confirm any heart strain and she is stable to discharge I will start her on amlodipine  for her elevated blood pressure and I have told her to quit smoking as her class II obesity and smoking are hyper estrogenic and probably were what caused her issues It may be beneficial to get hypercoagulable panel performed in the outpatient setting but her family history is nonsuggestive of clotting disorder   Procedures: Duplex lower extremity acute DVT in the bilateral peroneal veins and at the tibioperoneal trunk, and acute DVT in the left popliteal and posterior tibial vein Echocardiogram-IMPRESSIONS   1. Left ventricular ejection fraction, by estimation, is 65 to 70%. The  left ventricle has normal function. The left ventricle has no regional  wall motion abnormalities. There is moderate left ventricular hypertrophy.  Left ventricular diastolic  parameters were normal.   2. Right ventricular systolic function is normal. The right ventricular  size is normal. Tricuspid regurgitation signal is inadequate for assessing  PA pressure.   3. The mitral valve is grossly normal. Trivial mitral valve  regurgitation.   4. The aortic valve is tricuspid. Aortic valve regurgitation is not  visualized.   5. The inferior vena cava is normal in size with greater than 50%  respiratory variability, suggesting right atrial pressure of 3 mmHg.  CT chest as above--CT angio = pulmonary embolic disease distal left pulmonary artery posterior apical branch left upper lobe right lower lobe pulmonary arterial tree no ventricular strain main pulmonary arteries normal caliber dependent atelectasis LL lobe  Consultations: None  Discharge Exam: Vitals:   07/05/24 0741 07/05/24 0820  BP: (!) 156/95   Pulse: 82 88  Resp: 18 18  Temp: 98.3 F (36.8 C)   SpO2: 94% 95%    Subj on day of d/c    Awake coherent pleasant less heaviness in the chest  General Exam on discharge  EOMI NCAT no focal deficit no icterus or pallor no wheeze no rales no rhonchi CTAB no added sound Abdomen soft no rebound ROM intact Power 5/5  Discharge Instructions   Discharge Instructions     Diet - low sodium heart healthy   Complete by: As directed    Discharge instructions   Complete by: As directed    This hospital stay you were diagnosed with blood clots in your legs as well as blood clots in your lung that are life-threatening you need to be on blood thinners for at least 6 months going forward to help clear up the blood clots You should not take any over-the-counter nonsteroidals such as ibuprofen  and this medication unless you absolutely have to as that increases your risk of bleeding Please follow with your primary doctor and please take the blood thinner regularly   Increase activity slowly   Complete by: As directed       Allergies as of 07/05/2024       Reactions   Penicillins Other (See Comments)   Childhood allergy Has patient had a PCN reaction causing immediate rash, facial/tongue/throat swelling, SOB or lightheadedness with hypotension: No Has patient had a PCN reaction causing severe rash involving mucus membranes or skin necrosis: No Has patient had a PCN reaction that required hospitalization: No Has patient had a PCN reaction occurring within the last 10 years: No If all of the above answers are NO, then may proceed with Cephalosporin use.        Medication List     STOP taking these medications    ibuprofen  800 MG tablet Commonly known as: ADVIL        TAKE these medications    albuterol  108 (90 Base) MCG/ACT inhaler Commonly known as: VENTOLIN  HFA Inhale 2 puffs into the lungs every 4 (four) hours as needed for wheezing or shortness of breath. For shortness of breath   amLODipine  5 MG tablet Commonly known as: NORVASC  Take 5 mg by mouth daily. What  changed: Another medication with the same name was added. Make sure you understand how and when to take each.   amLODipine  10 MG tablet Commonly known as: NORVASC  Take 1 tablet (10 mg total) by mouth daily. What changed: You were already taking a medication with the same name, and this prescription was added. Make sure you understand how and when to take each.   apixaban  5 MG Tabs tablet Commonly known as: ELIQUIS  Take 2 tablets (10 mg total) by mouth 2 (two) times daily for 7 days, THEN 1 tablet (5 mg total) 2 (two) times daily. Start taking on: July 05, 2024   fluticasone -salmeterol 115-21 MCG/ACT inhaler Commonly known as: Advair HFA Inhale 2 puffs into the lungs 2 (two) times daily. What changed:  when to take this reasons to take this   gabapentin 300 MG capsule Commonly known as: NEURONTIN Take 300 mg by mouth daily as needed (for pain).   nicotine  14 mg/24hr patch Commonly known as: NICODERM CQ  - dosed in mg/24 hours Place 1 patch (14 mg total) onto the skin daily as needed (Nicotine  craving).   SUMAtriptan 25 MG  tablet Commonly known as: IMITREX Take 25 mg by mouth every 2 (two) hours as needed for migraine.       Allergies  Allergen Reactions   Penicillins Other (See Comments)    Childhood allergy Has patient had a PCN reaction causing immediate rash, facial/tongue/throat swelling, SOB or lightheadedness with hypotension: No Has patient had a PCN reaction causing severe rash involving mucus membranes or skin necrosis: No Has patient had a PCN reaction that required hospitalization: No Has patient had a PCN reaction occurring within the last 10 years: No If all of the above answers are NO, then may proceed with Cephalosporin use.     Follow-up Information     [name], [name]. Go in 1 week(s).   Specialty: Physician Assistant Why: 07/12/2024 @11 :00 a.m. Contact information: [address] [place] [address] [phone]                   The results of significant diagnostics from this hospitalization (including imaging, microbiology, ancillary and laboratory) are listed below for reference.    Significant Diagnostic Studies: ECHOCARDIOGRAM COMPLETE Result Date: 07/05/2024    ECHOCARDIOGRAM REPORT   Patient Name:   Debra Washington Date of Exam: 07/05/2024 Medical Rec #:  [id number]       Height:       61.0 in Accession #:    [id number]      Weight:       203.0 lb Date of Birth:  [birth year]        BSA:          1.901 m Patient Age:    43 years        BP:           156/95 mmHg Patient Gender: F               HR:           100 bpm. Exam Location:  Inpatient Procedure: 2D Echo, Cardiac Doppler and Color Doppler (Both Spectral and Color            Flow Doppler were utilized during procedure). Indications:    Pulmonary Embolus I26.09  History:        Patient has no prior history of Echocardiogram examinations.  Sonographer:    Tinnie Gosling RDCS Referring Phys: [id number] [facility] Hamzeh Tall IMPRESSIONS  1. Left ventricular ejection fraction, by estimation, is 65 to 70%. The left ventricle has normal function. The left ventricle has no regional wall motion abnormalities. There is moderate left ventricular hypertrophy. Left ventricular diastolic parameters were normal.  2. Right ventricular systolic function is normal. The right ventricular size is normal. Tricuspid regurgitation signal is inadequate for assessing PA pressure.  3. The mitral valve is grossly normal. Trivial mitral valve regurgitation.  4. The aortic valve is tricuspid. Aortic valve regurgitation is not visualized.  5. The inferior vena cava is normal in size with greater than 50% respiratory variability, suggesting right atrial pressure of 3 mmHg. Comparison(s): No prior Echocardiogram. FINDINGS  Left Ventricle: Left ventricular ejection fraction, by estimation, is 65 to 70%. The left ventricle has normal function. The left ventricle has no regional wall motion abnormalities. The  left ventricular internal cavity size was normal in size. There is  moderate left ventricular hypertrophy. Left ventricular diastolic parameters were normal. Right Ventricle: The right ventricular size is normal. No increase in right ventricular wall thickness. Right ventricular systolic function is normal. Tricuspid regurgitation signal is inadequate for assessing PA pressure.  Left Atrium: Left atrial size was normal in size. Right Atrium: Right atrial size was normal in size. Pericardium: There is no evidence of pericardial effusion. Mitral Valve: The mitral valve is grossly normal. Trivial mitral valve regurgitation. Tricuspid Valve: The tricuspid valve is normal in structure. Tricuspid valve regurgitation is not demonstrated. Aortic Valve: The aortic valve is tricuspid. Aortic valve regurgitation is not visualized. Pulmonic Valve: The pulmonic valve was normal in structure. Pulmonic valve regurgitation is not visualized. Aorta: The aortic root and ascending aorta are structurally normal, with no evidence of dilitation. Venous: The inferior vena cava is normal in size with greater than 50% respiratory variability, suggesting right atrial pressure of 3 mmHg. IAS/Shunts: No atrial level shunt detected by color flow Doppler.  LEFT VENTRICLE PLAX 2D LVIDd:         3.99 cm   Diastology LVIDs:         2.70 cm   LV e' medial:    10.40 cm/s LV PW:         1.56 cm   LV E/e' medial:  8.9 LV IVS:        1.36 cm   LV e' lateral:   13.70 cm/s LVOT diam:     2.20 cm   LV E/e' lateral: 6.8 LV SV:         84 LV SV Index:   44 LVOT Area:     3.80 cm  RIGHT VENTRICLE             IVC RV S prime:     13.80 cm/s  IVC diam: 1.51 cm TAPSE (M-mode): 1.5 cm LEFT ATRIUM           Index        RIGHT ATRIUM           Index LA diam:      2.68 cm 1.41 cm/m   RA Area:     11.60 cm LA Vol (A2C): 22.5 ml 11.84 ml/m  RA Volume:   28.60 ml  15.05 ml/m LA Vol (A4C): 22.5 ml 11.84 ml/m  AORTIC VALVE LVOT Vmax:   104.00 cm/s LVOT Vmean:  80.900  cm/s LVOT VTI:    0.221 m  AORTA Ao Root diam: 2.98 cm Ao Asc diam:  2.70 cm MITRAL VALVE MV Area (PHT): 7.37 cm    SHUNTS MV Decel Time: 103 msec    Systemic VTI:  0.22 m MV E velocity: 92.70 cm/s  Systemic Diam: 2.20 cm MV A velocity: 84.30 cm/s MV E/A ratio:  1.10 Vinie Maxcy MD Electronically signed by Vinie Maxcy MD Signature Date/Time: 07/05/2024/11:39:19 AM    Final    VAS US  LOWER EXTREMITY VENOUS (DVT) Result Date: 07/05/2024  Lower Venous DVT Study Patient Name:  MADDISEN VOUGHT  Date of Exam:   07/05/2024 Medical Rec #: [id number]        Accession #:    [id number] Date of Birth: 04-Oct-1981         Patient Gender: F Patient Age:   52 years Exam Location:  [facility] Procedure:      VAS US  LOWER EXTREMITY VENOUS (DVT) Referring Phys: COLEN GRIMES --------------------------------------------------------------------------------  Indications: Pulmonary embolism, Pain, and Swelling.  Risk Factors: Unhoused, lives out of vehicle. Limitations: Pain with compression maneuvers, especially throughout the thighs. Comparison Study: Prior negative right LEV done 03/13/23 and prior negative                   bilateral LEV done  at [facility] 03/05/20 Performing Technologist: Alberta Lis RVS  Examination Guidelines: A complete evaluation includes B-mode imaging, spectral Doppler, color Doppler, and power Doppler as needed of all accessible portions of each vessel. Bilateral testing is considered an integral part of a complete examination. Limited examinations for reoccurring indications may be performed as noted. The reflux portion of the exam is performed with the patient in reverse Trendelenburg.  +---------+---------------+---------+-----------+----------+-------------------+ RIGHT    CompressibilityPhasicitySpontaneityPropertiesThrombus Aging      +---------+---------------+---------+-----------+----------+-------------------+ CFV      Full           Yes      No                                        +---------+---------------+---------+-----------+----------+-------------------+ SFJ      Full                                                             +---------+---------------+---------+-----------+----------+-------------------+ FV Prox  Full                                                             +---------+---------------+---------+-----------+----------+-------------------+ FV Mid   Full           Yes      Yes                                      +---------+---------------+---------+-----------+----------+-------------------+ FV Distal               Yes      Yes                  patent by color and                                                       Doppler             +---------+---------------+---------+-----------+----------+-------------------+ PFV      Full                                                             +---------+---------------+---------+-----------+----------+-------------------+ POP      Full           Yes      Yes                  Rouleaux flow       +---------+---------------+---------+-----------+----------+-------------------+ PTV      Full                                                             +---------+---------------+---------+-----------+----------+-------------------+  PERO     None                                         Acute               +---------+---------------+---------+-----------+----------+-------------------+ Gastroc  Full           Yes      Yes                  Rouleaux flow       +---------+---------------+---------+-----------+----------+-------------------+ TP trunk None                                         Acute               +---------+---------------+---------+-----------+----------+-------------------+   +---------+---------------+---------+-----------+----------+-------------------+ LEFT      CompressibilityPhasicitySpontaneityPropertiesThrombus Aging      +---------+---------------+---------+-----------+----------+-------------------+ CFV      Full           Yes      No                                       +---------+---------------+---------+-----------+----------+-------------------+ SFJ      Full                                                             +---------+---------------+---------+-----------+----------+-------------------+ FV Prox  Full           Yes      Yes                                      +---------+---------------+---------+-----------+----------+-------------------+ FV Mid                  Yes      Yes                  patent by color and                                                       Doppler             +---------+---------------+---------+-----------+----------+-------------------+ FV Distal               Yes      Yes                  patent by color and                                                       Doppler             +---------+---------------+---------+-----------+----------+-------------------+  PFV      Full                                                             +---------+---------------+---------+-----------+----------+-------------------+ POP      None           No       No                   Acute               +---------+---------------+---------+-----------+----------+-------------------+ PTV      None                                         Acute               +---------+---------------+---------+-----------+----------+-------------------+ PERO     None                                         Acute               +---------+---------------+---------+-----------+----------+-------------------+ Gastroc  Full                                                             +---------+---------------+---------+-----------+----------+-------------------+ TP Trunk  Partial                                      Acute               +---------+---------------+---------+-----------+----------+-------------------+    Summary: RIGHT: - Findings consistent with acute deep vein thrombosis involving the right peroneal veins, and Tibioperoneal trunk.  - No cystic structure found in the popliteal fossa.  LEFT: - Findings consistent with acute deep vein thrombosis involving the left popliteal vein, left posterior tibial veins, left peroneal veins, and Tibioperoneal trunk.  - No cystic structure found in the popliteal fossa.  *See table(s) above for measurements and observations.    Preliminary    CT Angio Chest Pulmonary Embolism (PE) W or WO Contrast Addendum Date: 07/04/2024 ADDENDUM: The above findings were discussed with Dr. Jerral at 06:06 am on 07/04/2024 ---------------------------------------------------- Electronically signed by: Evalene Coho MD 07/04/2024 06:22 AM EDT RP Workstation: [id number]   Result Date: 07/04/2024 ORIGINAL REPORT EXAM: CTA CHEST 07/04/2024 05:25:15 AM TECHNIQUE: CTA of the chest was performed after the administration of intravenous contrast. Multiplanar reformatted images are provided for review. MIP images are provided for review. Automated exposure control, iterative reconstruction, and/or weight based adjustment of the mA/kV was utilized to reduce the radiation dose to as low as reasonably achievable. COMPARISON: CT angiogram of the chest dated 03/26/2023. CLINICAL HISTORY: Pulmonary embolism (PE) suspected, low to intermediate prob, positive D-dimer. SOB and sharp pain under left breast after eating boneless chicken wings at midnight. Hx asthma. FINDINGS: PULMONARY ARTERIES: Pulmonary embolic  disease present bilaterally, seen within the distal left pulmonary artery and within the posterior apical branch of the left upper lobe. Pulmonary embolus also present within the right lower lobe pulmonary arterial tree involving the medial basal  artery and the superior segmental artery. There is also thromboembolus within the anterior segmental branch of the right upper lobe. The main pulmonary artery is normal in caliber. MEDIASTINUM: The heart and pericardium demonstrate no acute abnormality. There is no right ventricular strain. There is no acute abnormality of the thoracic aorta. LYMPH NODES: No mediastinal, hilar or axillary lymphadenopathy. LUNGS AND PLEURA: Mild atelectasis present dependently within the left lower lobe. No focal consolidation or pulmonary edema. No evidence of pleural effusion or pneumothorax. UPPER ABDOMEN: Limited images of the upper abdomen are unremarkable. SOFT TISSUES AND BONES: No acute bone or soft tissue abnormality. IMPRESSION: 1. Pulmonary embolic disease involving the distal left pulmonary artery, posterior apical branch of the left upper lobe, right lower lobe pulmonary arterial tree (medial basal and superior segmental arteries), and anterior segmental branch of the right upper lobe. 2. No right ventricular strain. Main pulmonary artery is normal in caliber. 3. Mild dependent atelectasis in the left lower lobe. Electronically signed by: Evalene Coho MD 07/04/2024 05:55 AM EDT RP Workstation: [id number]   DG Chest Portable 1 View Result Date: 07/04/2024 EXAM: 1 VIEW XRAY OF THE CHEST 07/04/2024 04:00:00 AM COMPARISON: PA and lateral radiographs of the chest dated 03/26/2023. CLINICAL HISTORY: Chest pain. SOB; chest wall pain. Hx asthma. FINDINGS: LUNGS AND PLEURA: Subtle hazy opacification of the right medial lung base. No pulmonary edema. No pleural effusion. No pneumothorax. HEART AND MEDIASTINUM: No acute abnormality of the cardiac and mediastinal silhouettes. BONES AND SOFT TISSUES: No acute osseous abnormality. IMPRESSION: 1. Subtle hazy opacification of the right medial lung base. Electronically signed by: Evalene Coho MD 07/04/2024 04:24 AM EDT RP Workstation: [id number]    Microbiology: Recent  Results (from the past 240 hours)  Resp panel by RT-PCR (RSV, Flu A&B, Covid) Anterior Nasal Swab     Status: None   Collection Time: 07/04/24  4:58 AM   Specimen: Anterior Nasal Swab  Result Value Ref Range Status   SARS Coronavirus 2 by RT PCR NEGATIVE NEGATIVE Final    Comment: (NOTE) SARS-CoV-2 target nucleic acids are NOT DETECTED.  The SARS-CoV-2 RNA is generally detectable in upper respiratory specimens during the acute phase of infection. The lowest concentration of SARS-CoV-2 viral copies this assay can detect is 138 copies/mL. A negative result does not preclude SARS-Cov-2 infection and should not be used as the sole basis for treatment or other patient management decisions. A negative result may occur with  improper specimen collection/handling, submission of specimen other than nasopharyngeal swab, presence of viral mutation(s) within the areas targeted by this assay, and inadequate number of viral copies(<138 copies/mL). A negative result must be combined with clinical observations, patient history, and epidemiological information. The expected result is Negative.  Fact Sheet for Patients:  BloggerCourse.com  Fact Sheet for Healthcare Providers:  SeriousBroker.it  This test is no t yet approved or cleared by the [place]  FDA and  has been authorized for detection and/or diagnosis of SARS-CoV-2 by FDA under an Emergency Use Authorization (EUA). This EUA will remain  in effect (meaning this test can be used) for the duration of the COVID-19 declaration under Section 564(b)(1) of the Act, 21 U.S.C.section 360bbb-3(b)(1), unless the authorization is terminated  or revoked sooner.       Influenza A  by PCR NEGATIVE NEGATIVE Final   Influenza B by PCR NEGATIVE NEGATIVE Final    Comment: (NOTE) The Xpert Xpress SARS-CoV-2/FLU/RSV plus assay is intended as an aid in the diagnosis of influenza from Nasopharyngeal swab  specimens and should not be used as a sole basis for treatment. Nasal washings and aspirates are unacceptable for Xpert Xpress SARS-CoV-2/FLU/RSV testing.  Fact Sheet for Patients: BloggerCourse.com  Fact Sheet for Healthcare Providers: SeriousBroker.it  This test is not yet approved or cleared by the [place]  FDA and has been authorized for detection and/or diagnosis of SARS-CoV-2 by FDA under an Emergency Use Authorization (EUA). This EUA will remain in effect (meaning this test can be used) for the duration of the COVID-19 declaration under Section 564(b)(1) of the Act, 21 U.S.C. section 360bbb-3(b)(1), unless the authorization is terminated or revoked.     Resp Syncytial Virus by PCR NEGATIVE NEGATIVE Final    Comment: (NOTE) Fact Sheet for Patients: BloggerCourse.com  Fact Sheet for Healthcare Providers: SeriousBroker.it  This test is not yet approved or cleared by the [place]  FDA and has been authorized for detection and/or diagnosis of SARS-CoV-2 by FDA under an Emergency Use Authorization (EUA). This EUA will remain in effect (meaning this test can be used) for the duration of the COVID-19 declaration under Section 564(b)(1) of the Act, 21 U.S.C. section 360bbb-3(b)(1), unless the authorization is terminated or revoked.  Performed at [facility], [address] [place], [place] [address]      Labs: Basic Metabolic Panel: Recent Labs  Lab 07/04/24 0416 07/05/24 0254  NA 141 139  K 3.5 4.3  CL 107 109  CO2 24 21*  GLUCOSE 100* 126*  BUN 14 12  CREATININE 0.78 0.79  CALCIUM 8.9 8.5*   Liver Function Tests: Recent Labs  Lab 07/05/24 0254  AST 13*  ALT 15  ALKPHOS 43  BILITOT 0.4  PROT 6.0*  ALBUMIN 2.6*   No results for input(s): LIPASE, AMYLASE in the last 168 hours. No results for input(s): AMMONIA in the last 168  hours. CBC: Recent Labs  Lab 07/04/24 0416 07/05/24 0254  WBC 8.0 11.7*  NEUTROABS 5.2  --   HGB 11.8* 11.3*  HCT 36.0 34.5*  MCV 83.9 82.9  PLT 193 201   Cardiac Enzymes: No results for input(s): CKTOTAL, CKMB, CKMBINDEX, TROPONINI in the last 168 hours. BNP: BNP (last 3 results) No results for input(s): BNP in the last 8760 hours.  ProBNP (last 3 results) Recent Labs    07/04/24 0617  PROBNP 55.8    CBG: No results for input(s): GLUCAP in the last 168 hours.  Signed:  Colen Grimes MD   [facility] 07/05/2024, 11:41 AM

## 2024-07-05 NOTE — Progress Notes (Signed)
 PHARMACY - ANTICOAGULATION CONSULT NOTE  Pharmacy Consult for heparin  Indication: pulmonary embolus  Labs: Recent Labs    07/04/24 0416 07/04/24 0617 07/04/24 1611 07/05/24 0005  HGB 11.8*  --   --   --   HCT 36.0  --   --   --   PLT 193  --   --   --   APTT  --  29  --   --   LABPROT  --  14.5  --   --   INR  --  1.1  --   --   HEPARINUNFRC  --   --  0.85* 0.49  CREATININE 0.78  --   --   --    Assessment/Plan:  43yo female therapeutic on heparin  after rate change. Will continue infusion at current rate of 1100 units/hr until off w/ first dose of apixaban .  Marvetta Dauphin, PharmD, BCPS 07/05/2024 12:27 AM

## 2024-07-05 NOTE — Plan of Care (Signed)
   Problem: Education: Goal: Knowledge of General Education information will improve Description: Including pain rating scale, medication(s)/side effects and non-pharmacologic comfort measures Outcome: Progressing   Problem: Nutrition: Goal: Adequate nutrition will be maintained Outcome: Progressing   Problem: Coping: Goal: Level of anxiety will decrease Outcome: Progressing

## 2024-07-05 NOTE — Progress Notes (Signed)
  Echocardiogram 2D Echocardiogram has been performed.  Tinnie FORBES Gosling RDCS 07/05/2024, 9:10 AM

## 2024-07-05 NOTE — Telephone Encounter (Signed)
 Pharmacy Patient Advocate Encounter  Insurance verification completed.    The patient is insured through [facility] MEDICAID.     Ran test claim for Eliquis  and the current 30 day co-pay is $4.00.  Ran test claim for Xarelto and the current 30 day co-pay is $4.00.  This test claim was processed through [facility]- copay amounts may vary at other pharmacies due to pharmacy/plan contracts, or as the patient moves through the different stages of their insurance plan.

## 2024-07-05 NOTE — Progress Notes (Signed)
 VASCULAR LAB    Bilateral lower extremity venous duplex has been performed.  See CV proc for preliminary results.   Mattheus Rauls, RVT 07/05/2024, 10:57 AM

## 2024-07-12 DIAGNOSIS — Z86718 Personal history of other venous thrombosis and embolism: Secondary | ICD-10-CM | POA: Diagnosis not present

## 2024-07-12 DIAGNOSIS — J45909 Unspecified asthma, uncomplicated: Secondary | ICD-10-CM | POA: Diagnosis not present

## 2024-07-12 DIAGNOSIS — Z7901 Long term (current) use of anticoagulants: Secondary | ICD-10-CM | POA: Diagnosis not present

## 2024-07-12 DIAGNOSIS — M5440 Lumbago with sciatica, unspecified side: Secondary | ICD-10-CM | POA: Diagnosis not present

## 2024-07-12 DIAGNOSIS — G43101 Migraine with aura, not intractable, with status migrainosus: Secondary | ICD-10-CM | POA: Diagnosis not present

## 2024-07-12 DIAGNOSIS — R7303 Prediabetes: Secondary | ICD-10-CM | POA: Diagnosis not present

## 2024-07-12 DIAGNOSIS — E669 Obesity, unspecified: Secondary | ICD-10-CM | POA: Diagnosis not present

## 2024-07-12 DIAGNOSIS — I10 Essential (primary) hypertension: Secondary | ICD-10-CM | POA: Diagnosis not present

## 2024-07-12 DIAGNOSIS — Z720 Tobacco use: Secondary | ICD-10-CM | POA: Diagnosis not present

## 2024-07-30 ENCOUNTER — Telehealth: Payer: Self-pay

## 2024-07-30 NOTE — Telephone Encounter (Signed)
 Left the patient a voicemail with the rescheduled appointment details per original provider going on PAL. The patient is also active on MyChart.

## 2024-08-13 NOTE — Progress Notes (Deleted)
 [facility] CONSULT NOTE  Patient Care Team: Debra Washington, Debra Washington as PCP - General (Physician Assistant)   ASSESSMENT & PLAN:  43 y.o.female with history of asthma, cigarette smoking, homelessness referred to [facility] for history of ***.   First episode: 06/2024 Setting: Treatment:  Report patient is active smoker.  Her BMI is 38.  This last episode of blood clot appeared to be ***unprovoked. We discussed about the pros and cons about testing for thrombophilia disorder. For known thrombophilia testing, most of which do not impact the risk of recurrence and do not change the therapeutic options or recommendations for duration of therapy.  These include protein C, protein S and antithrombin deficiencies, factor V Leiden, the prothrombin G20210A mutation and antiphospholipid antibody syndrome.  Patient was informed even if these are negative and the family history is negative, this does not rule out an inheritable component to hypercoagulability. Patient can still develop recurrent venous thrombosis with negative testing. For patients with unprovoked VTE, the risks of recurrent VTE after completing a course of anticoagulant therapy have been estimated to be 10% by 2 years and >30% by 10 years. ASH 2020. Therefore, I do not see a reason to order excessive testing to screen for thrombophilia disorder as it would not change our management. The goal of anticoagulation therapy is for life.   We discussed about various options of anticoagulation therapies including warfarin, low molecular weight heparin  such as Lovenox  or newer agents such as Rivaroxaban. Some of the risks and benefits discussed including costs involved, the need for monitoring, risks of life-threatening bleeding/hospitalization, reversibility of each agent in the event of bleeding or overdose, safety profile of each drug and taking into account other social issues such as ease of  administration of medications, etc. Ultimately, we have made an informed decision for the patient to continue her treatment with ***  Finally, at the end of our consultation today, I reinforced the importance of preventive strategies such as avoiding hormonal supplement, avoiding cigarette smoking, keeping up-to-date with screening programs for early cancer detection, frequent ambulation for long distance travel and aggressive DVT prophylaxis in all surgical settings. Assessment & Plan    Patient education for risk factors and prevention of clotting We talked about modifiable risk factors.  Prevention of clotting like deep vein thrombosis including: Strong risk factors including fractures of lower limb, hospitalization for severe illness, such as heart failure, myocardial infarction, spinal cord injury, major trauma, hip or knee replacement, and previous VTE. avoid prolonged immobilization and moving extremities every 1-2 hours during long car rides or flights.  Taking a break and moving extremities if working in a job setting with prolonged sitting.   Avoid dehydration, especially in high altitude. Avoid cigarette smoking Avoid use of hormone replacement therapy, estrogen-containing contraceptive in women.   Maintaining healthy lifestyle to prevent development of diabetes.  Weight loss if BMI over 30.  Regular exercises but not extreme.  Stay hydrated with exercises. Other risk factors for clotting are surgery, hospitalization, inflammatory disease or severe infection, trauma or injuries from inflammatory state and stasis. If developing one-sided leg swelling, pain, color change, chest pain, sudden short of breath, difficulty taking deep breaths, taking deep breath with chest discomfort or pain, dizziness or heart racing sensation, go to the emergency room immediately for evaluation. If developing trauma, uncontrolled bleeding, such as bloody stools report ED immediately. Avoid NSAIDs, aspirin  while on blood thinner.  Patient should avoid elective surgery in  the acute thrombosis period for 3 months.  Discussed bleeding precautions and avoid high risk activities for falling while on anticoagulation. Anticoagulants do not cause bleeding.  Rather, if bleeding occurs when one is on anticoagulation, it may take longer for the bleeding to stop due to reduce coagulation capacity.  I reviewed with the patient about the plan for care for recurrent ***DVT/PE.  ***I have not made a return appointment for the patient to come back. I would be happy to assist in perioperative DVT management in the future should she need any interruption of her anticoagulation therapy for elective procedures.  No orders of the defined types were placed in this encounter.   All questions were answered. The patient knows to call the clinic with any problems, questions or concerns.  I personally spent a total of *** minutes in the care of the patient today including {Time Based Coding:[id number]}.   Debra JAYSON Chihuahua, MD 10/7/20251:37 PM  CHIEF COMPLAINTS/PURPOSE OF CONSULTATION:  ***  HISTORY OF PRESENTING ILLNESS:  Debra Washington 43 y.o. female is here because of ***  02/24/20 US : No evidence of deep venous thrombosis in either lower extremity.   03/13/23: US : No DVT 03/26/23 CTA: No evidence of pulmonary embolism.   07/04/24 presented to the ED CTA:  1. Pulmonary embolic disease involving the distal left pulmonary artery, posterior apical branch of the left upper lobe, right lower lobe pulmonary arterial tree (medial basal and superior segmental arteries), and anterior segmental branch of the right upper lobe. 2. No right ventricular strain. Main pulmonary artery is normal in caliber. 3. Mild dependent atelectasis in the left lower lobe.  07/05/24 US : Acute bilateral lower extremity DVT  Admitted with IV heparin  and transition to apixaban .  ***She denies ***lower extremity swelling, warmth, tenderness  & erythema.  She denies recent chest pain on exertion, shortness of breath on minimal exertion, pre-syncopal episodes, hemoptysis, or palpitation. ***She denies recent *** history of trauma, *** long distance travel, dehydration, recent surgery, ***smoking or prolonged immobilization. ***She had no prior history or diagnosis of cancer. Her age appropriate screening programs are up-to-date. ***She had prior surgeries before and never had perioperative thromboembolic events. ***The patient had been exposed to birth control pills *** hormone replacement therapy *** testosterone replacement therapy and never had thrombotic events. ***The patient had been pregnant before and denies history of peripartum thromboembolic event or history of recurrent miscarriages. ***There is no family history of blood clots or miscarriages.  MEDICAL HISTORY:  Past Medical History:  Diagnosis Date   Asthma    Hx of ectopic pregnancy 2006   Kidney stones    Migraine    Obesity     SURGICAL HISTORY: Past Surgical History:  Procedure Laterality Date   CARPAL TUNNEL RELEASE Right 01/2018   IR ANGIO INTRA EXTRACRAN SEL COM CAROTID INNOMINATE BILAT MOD SED  07/05/2018   IR ANGIO VERTEBRAL SEL VERTEBRAL BILAT MOD SED  07/05/2018   SALPINGOOPHORECTOMY  2006   pt not sure if her ovaries were removed     SOCIAL HISTORY: Social History   Socioeconomic History   Marital status: Single    Spouse name: Not on file   Number of children: 3   Years of education: Not on file   Highest education level: Not on file  Occupational History   Not on file  Tobacco Use   Smoking status: Every Day    Types: Cigars   Smokeless tobacco: Never   Tobacco comments:    3 black  and milds a day  Vaping Use   Vaping status: Never Used  Substance and Sexual Activity   Alcohol use: Yes    Comment: occ   Drug use: Yes    Types: Marijuana    Comment: occ   Sexual activity: Yes    Partners: Male    Birth control/protection:  Surgical  Other Topics Concern   Not on file  Social History Narrative   3 children, one in her care.      Lives at home with her son   Right handed   Social Drivers of Health   Financial Resource Strain: Not on file  Food Insecurity: Food Insecurity Present (03/27/2023)   Hunger Vital Sign    Worried About Running Out of Food in the Last Year: Often true    Ran Out of Food in the Last Year: Often true  Transportation Needs: No Transportation Needs (03/27/2023)   PRAPARE - Administrator, Civil Service (Medical): No    Lack of Transportation (Non-Medical): No  Physical Activity: Unknown (07/05/2018)   Exercise Vital Sign    Days of Exercise per Week: Patient declined    Minutes of Exercise per Session: Patient declined  Stress: Not on file  Social Connections: Unknown (07/05/2018)   Social Connection and Isolation Panel    Frequency of Communication with Friends and Family: Patient declined    Frequency of Social Gatherings with Friends and Family: Patient declined    Attends Religious Services: Patient declined    Database administrator or Organizations: Patient declined    Attends Banker Meetings: Patient declined    Marital Status: Patient declined  Intimate Partner Violence: Not At Risk (03/27/2023)   Humiliation, Afraid, Rape, and Kick questionnaire    Fear of Current or Ex-Partner: No    Emotionally Abused: No    Physically Abused: No    Sexually Abused: No    FAMILY HISTORY: Family History  Problem Relation Age of Onset   Migraines Mother    Aneurysm Father    Congestive Heart Failure Maternal Grandmother    Hypertension Maternal Grandmother    Heart failure Maternal Grandmother    Asthma Neg Hx     ALLERGIES:  is allergic to penicillins.  MEDICATIONS:  Current Outpatient Medications  Medication Sig Dispense Refill   albuterol  (VENTOLIN  HFA) 108 (90 Base) MCG/ACT inhaler Inhale 2 puffs into the lungs every 4 (four) hours as needed  for wheezing or shortness of breath. For shortness of breath 1 each 3   amLODipine  (NORVASC ) 10 MG tablet Take 1 tablet (10 mg total) by mouth daily. 30 tablet 11   amLODipine  (NORVASC ) 5 MG tablet Take 5 mg by mouth daily.     apixaban  (ELIQUIS ) 5 MG TABS tablet Take 2 tablets (10 mg total) by mouth 2 (two) times daily for 7 days, THEN 1 tablet (5 mg total) 2 (two) times daily. 374 tablet 0   fluticasone -salmeterol (ADVAIR HFA) 115-21 MCG/ACT inhaler Inhale 2 puffs into the lungs 2 (two) times daily. (Patient taking differently: Inhale 2 puffs into the lungs 2 (two) times daily as needed (for shortness of breath).) 1 each 12   gabapentin (NEURONTIN) 300 MG capsule Take 300 mg by mouth daily as needed (for pain).     nicotine  (NICODERM CQ  - DOSED IN MG/24 HOURS) 14 mg/24hr patch Place 1 patch (14 mg total) onto the skin daily as needed (Nicotine  craving). 28 patch 0   SUMAtriptan (IMITREX) 25 MG  tablet Take 25 mg by mouth every 2 (two) hours as needed for migraine.     No current facility-administered medications for this visit.    REVIEW OF SYSTEMS:   All relevant systems were reviewed with the patient and are negative.  PHYSICAL EXAMINATION: ECOG PERFORMANCE STATUS: {CHL ONC ECOG PS:[id number]}  There were no vitals filed for this visit. There were no vitals filed for this visit.  GENERAL: alert, no distress and comfortable SKIN: skin color normal LUNGS: Effort normal and no respiratory distress.  Clear to auscultation HEART: regular rate & rhythm ABDOMEN: abdomen soft, non-tender Musculoskeletal: no cyanosis and edema.  Right lower extremity *** left lower extremity ***  LABORATORY DATA:  I have reviewed the data as listed   RADIOGRAPHIC STUDIES: I have personally reviewed the radiological images as listed and agreed with the findings in the report. No results found.

## 2024-08-15 ENCOUNTER — Inpatient Hospital Stay

## 2024-08-16 ENCOUNTER — Telehealth: Payer: Self-pay

## 2024-08-16 ENCOUNTER — Other Ambulatory Visit

## 2024-08-16 ENCOUNTER — Encounter: Admitting: Physician Assistant

## 2024-08-16 NOTE — Telephone Encounter (Signed)
 S/w patient regarding after-hours triage message. Clarified with patient that she had missed her 10/9 appointments for new hem referral with Dr. Tina and lab work. A new scheduling message will be sent to get appointments rescheduled for another time. Patient aware that someone from our scheduling team will be reaching out next week to make the appointments.  Patient verbalized an understanding of the information and voiced appreciation for the call.

## 2024-08-19 ENCOUNTER — Encounter: Payer: Self-pay | Admitting: Podiatry

## 2024-08-19 ENCOUNTER — Ambulatory Visit: Admitting: Podiatry

## 2024-08-19 ENCOUNTER — Ambulatory Visit (INDEPENDENT_AMBULATORY_CARE_PROVIDER_SITE_OTHER)

## 2024-08-19 DIAGNOSIS — M76821 Posterior tibial tendinitis, right leg: Secondary | ICD-10-CM | POA: Diagnosis not present

## 2024-08-19 DIAGNOSIS — M216X1 Other acquired deformities of right foot: Secondary | ICD-10-CM

## 2024-08-19 DIAGNOSIS — M216X2 Other acquired deformities of left foot: Secondary | ICD-10-CM

## 2024-08-19 MED ORDER — TRIAMCINOLONE ACETONIDE 10 MG/ML IJ SUSP
10.0000 mg | Freq: Once | INTRAMUSCULAR | Status: AC
  Administered 2024-08-19: 10 mg via INTRA_ARTICULAR

## 2024-08-19 NOTE — Progress Notes (Signed)
 Subjective:   Patient ID: Debra Washington, female   DOB: 43 y.o.   MRN: [id number]   HPI Patient presents with a lot of pain of the right inside ankle and states that she also has flatfoot deformity and deformity in general of her feet and has trouble being active   ROS      Objective:  Physical Exam  Neurovascular status intact inflammation of the posterior tibial tendon right with collapse of medial longitudinal arch bilateral     Assessment:  Foot structural deformity bilateral with tendinitis of the posterior tibial tendon     Plan:  H&P reviewed careful sheath injection right after explaining risk 3 mg dexamethasone Kenalog 5 mg Xylocaine  and casted for functional orthotics to help with the chronic flatfoot deformity present.  X-rays do indicate significant flatfoot deformity with multiple signs of deformity current subtalar joint

## 2024-08-21 ENCOUNTER — Other Ambulatory Visit: Payer: Self-pay

## 2024-08-22 ENCOUNTER — Inpatient Hospital Stay

## 2024-08-22 ENCOUNTER — Inpatient Hospital Stay (HOSPITAL_BASED_OUTPATIENT_CLINIC_OR_DEPARTMENT_OTHER): Admitting: Internal Medicine

## 2024-08-22 ENCOUNTER — Other Ambulatory Visit: Payer: Self-pay

## 2024-08-22 VITALS — BP 136/80 | HR 73 | Temp 97.7°F | Resp 17 | Ht 61.0 in | Wt 215.0 lb

## 2024-08-22 DIAGNOSIS — I2699 Other pulmonary embolism without acute cor pulmonale: Secondary | ICD-10-CM

## 2024-08-22 DIAGNOSIS — Z139 Encounter for screening, unspecified: Secondary | ICD-10-CM

## 2024-08-22 NOTE — Progress Notes (Signed)
 [facility] [phone]   [phone]  CONSULT NOTE  REFERRING PHYSICIAN: Rosina Amy, PA  REASON FOR CONSULTATION:  43 years old African-American female with bilateral deep venous thrombosis and pulmonary emboli  HPI Debra Washington is a 43 y.o. female.   HPI  Discussed the use of AI scribe software for clinical note transcription with the patient, who gave verbal consent to proceed.  History of Present Illness Debra Washington is a 43 year old female with bilateral deep venous thrombosis and pulmonary embolism who presents for evaluation after recent diagnosis.  She is homeless.  She experienced a sharp chest pain while sleeping in her car, initially attributing it to gas. The pain intensified, leading her son to call 911, resulting in her hospitalization on July 04, 2024. A CT angiogram identified blood clots in the distal left pulmonary artery, posterior apical branch of the left upper lobe, and right lower lobe. An echocardiogram showed no abnormalities, and a Doppler ultrasound confirmed clots in both legs.  During her hospital stay, she was initially treated with intravenous Heparin  and then transitioned to Eliquis , which she currently takes at a dose of 5 mg twice daily. She started with a higher dose of 10 mg twice daily before reducing to the current dose. She handles the medication well.  She denies recent travel or prolonged immobility, although she mentions living in her car and sleeping in the driver's seat with her legs propped up. She works as a Engineer, water, which involves a lot of walking, and is currently on short-term disability. She plans to move to her daughter's [place] while waiting for housing assistance.  Her medical history includes asthma, kidney stones, migraine headaches, obesity, and a past ectopic pregnancy. She recently quit smoking after her hospital discharge. She occasionally uses marijuana  but reports no recent alcohol or drug use due to financial constraints.  No current chest pain, shortness of breath, nausea, vomiting, diarrhea, or headaches. She mentions a history of bronchitis and low oxygen levels. No family history of blood clots, though her grandmother died of congestive heart failure and her father of an aneurysm.     Past Medical History:  Diagnosis Date   Asthma    Hx of ectopic pregnancy 2006   Kidney stones    Migraine    Obesity       Past Surgical History:  Procedure Laterality Date   CARPAL TUNNEL RELEASE Right 01/2018   IR ANGIO INTRA EXTRACRAN SEL COM CAROTID INNOMINATE BILAT MOD SED  07/05/2018   IR ANGIO VERTEBRAL SEL VERTEBRAL BILAT MOD SED  07/05/2018   SALPINGOOPHORECTOMY  2006   pt not sure if her ovaries were removed     Family History  Problem Relation Age of Onset   Migraines Mother    Aneurysm Father    Congestive Heart Failure Maternal Grandmother    Hypertension Maternal Grandmother    Heart failure Maternal Grandmother    Asthma Neg Hx     Social History Social History   Tobacco Use   Smoking status: Every Day    Types: Cigars   Smokeless tobacco: Never   Tobacco comments:    3 black and milds a day  Vaping Use   Vaping status: Never Used  Substance Use Topics   Alcohol use: Yes    Comment: occ   Drug use: Yes    Types: Marijuana    Comment: occ    Allergies  Allergen  Reactions   Penicillins Other (See Comments)    Childhood allergy Has patient had a PCN reaction causing immediate rash, facial/tongue/throat swelling, SOB or lightheadedness with hypotension: No Has patient had a PCN reaction causing severe rash involving mucus membranes or skin necrosis: No Has patient had a PCN reaction that required hospitalization: No Has patient had a PCN reaction occurring within the last 10 years: No If all of the above answers are NO, then may proceed with Cephalosporin use.     Current Outpatient Medications   Medication Sig Dispense Refill   albuterol  (VENTOLIN  HFA) 108 (90 Base) MCG/ACT inhaler Inhale 2 puffs into the lungs every 4 (four) hours as needed for wheezing or shortness of breath. For shortness of breath 1 each 3   amLODipine  (NORVASC ) 10 MG tablet Take 1 tablet (10 mg total) by mouth daily. 30 tablet 11   amLODipine  (NORVASC ) 5 MG tablet Take 5 mg by mouth daily.     apixaban  (ELIQUIS ) 5 MG TABS tablet Take 2 tablets (10 mg total) by mouth 2 (two) times daily for 7 days, THEN 1 tablet (5 mg total) 2 (two) times daily. 374 tablet 0   fluticasone -salmeterol (ADVAIR HFA) 115-21 MCG/ACT inhaler Inhale 2 puffs into the lungs 2 (two) times daily. (Patient taking differently: Inhale 2 puffs into the lungs 2 (two) times daily as needed (for shortness of breath).) 1 each 12   gabapentin (NEURONTIN) 300 MG capsule Take 300 mg by mouth daily as needed (for pain).     nicotine  (NICODERM CQ  - DOSED IN MG/24 HOURS) 14 mg/24hr patch Place 1 patch (14 mg total) onto the skin daily as needed (Nicotine  craving). 28 patch 0   SUMAtriptan (IMITREX) 25 MG tablet Take 25 mg by mouth every 2 (two) hours as needed for migraine.     No current facility-administered medications for this visit.    Review of Systems  Constitutional: positive for fatigue Eyes: negative Ears, nose, mouth, throat, and face: negative Respiratory: positive for dyspnea on exertion Cardiovascular: negative Gastrointestinal: negative Genitourinary:negative Integument/breast: negative Hematologic/lymphatic: negative Musculoskeletal:negative Neurological: negative Behavioral/Psych: negative Endocrine: negative Allergic/Immunologic: negative  Physical Exam  MJO:jozmu, healthy, no distress, well nourished, well developed, and obese SKIN: skin color, texture, turgor are normal, no rashes or significant lesions HEAD: Normocephalic, No masses, lesions, tenderness or abnormalities EYES: normal, PERRLA, Conjunctiva are pink and  non-injected EARS: External ears normal, Canals clear OROPHARYNX:no exudate, no erythema, and lips, buccal mucosa, and tongue normal  NECK: supple, no adenopathy, no JVD LYMPH:  no palpable lymphadenopathy, no hepatosplenomegaly BREAST:not examined LUNGS: clear to auscultation , and palpation HEART: regular rate & rhythm, no murmurs, and no gallops ABDOMEN:abdomen soft, non-tender, obese, normal bowel sounds, and no masses or organomegaly BACK: Back symmetric, no curvature., No CVA tenderness EXTREMITIES:no joint deformities, effusion, or inflammation, no edema  NEURO: alert & oriented x 3 with fluent speech, no focal motor/sensory deficits  PERFORMANCE STATUS: ECOG 1  LABORATORY DATA: Lab Results  Component Value Date   WBC 11.7 (H) 07/05/2024   HGB 11.3 (L) 07/05/2024   HCT 34.5 (L) 07/05/2024   MCV 82.9 07/05/2024   PLT 201 07/05/2024      Chemistry      Component Value Date/Time   NA 139 07/05/2024 0254   NA 141 05/02/2018 1508   K 4.3 07/05/2024 0254   CL 109 07/05/2024 0254   CO2 21 (L) 07/05/2024 0254   BUN 12 07/05/2024 0254   BUN 15 05/02/2018 1508  CREATININE 0.79 07/05/2024 0254      Component Value Date/Time   CALCIUM 8.5 (L) 07/05/2024 0254   ALKPHOS 43 07/05/2024 0254   AST 13 (L) 07/05/2024 0254   ALT 15 07/05/2024 0254   BILITOT 0.4 07/05/2024 0254       RADIOGRAPHIC STUDIES: DG Foot Complete Right Result Date: 08/19/2024 Please see detailed radiograph report in office note.   ASSESSMENT:   PLAN: Assessment and Plan Assessment & Plan Bilateral lower extremity deep vein thrombosis and pulmonary embolism Diagnosed on July 04, 2024, with clots in the distal left pulmonary artery, posterior apical branch of the left upper lobe, and right lower lobe. No right ventricular strain on echocardiogram. Risk factors include obesity, tobacco use, and prolonged immobility due to sleeping in a car as a homeless person. Currently asymptomatic for chest  pain, shortness of breath, or bleeding disorders. - Continue Eliquis  5 mg twice daily until end of March 2026 - Evaluate for genetic or acquired clotting disorders after stopping Eliquis  in March 2026 - Advise to avoid prolonged immobility and to hydrate regularly - Instruct to take breaks during long drives to prevent clot formation  Obesity Obesity is a contributing risk factor for deep vein thrombosis and pulmonary embolism. No recent unintentional weight loss reported.  Tobacco use Tobacco use is a risk factor for clot formation. She has quit smoking following hospitalization.  Follow-up Plan to monitor and evaluate her condition post-anticoagulation therapy. - Schedule follow-up appointment in April 2026 for blood tests to evaluate clotting disorders - Coordinate with primary care provider, Rosina at [facility], for ongoing medication management She was advised to call immediately if she has any other concerning symptoms in the interval.    The patient voices understanding of current disease status and treatment options and is in agreement with the current care plan.  All questions were answered. The patient knows to call the clinic with any problems, questions or concerns. We can certainly see the patient much sooner if necessary.  Thank you so much for allowing me to participate in the care of Debra Washington. I will continue to follow up the patient with you and assist in her care.  The total time spent in the appointment was 60 minutes including review of chart and various tests results, discussions about plan of care and coordination of care plan .  Disclaimer: This note was dictated with voice recognition software. Similar sounding words can inadvertently be transcribed and may not be corrected upon review.   Sherrod MARLA Sherrod August 22, 2024, 12:33 PM

## 2024-08-23 ENCOUNTER — Inpatient Hospital Stay: Admitting: Licensed Clinical Social Worker

## 2024-08-23 DIAGNOSIS — Z139 Encounter for screening, unspecified: Secondary | ICD-10-CM

## 2024-08-26 NOTE — Progress Notes (Signed)
 [facility] CSW Progress Note  Clinical Child psychotherapist contacted patient by phone to follow-up on SDOH needs.    Interventions: CSW received a referral to contact pt regarding SDOH screen.  Pt reports she has been living in her car; however, a friend gave her money for gas which allowed her to drive to her daughter's home in [place].  Pt states she is on the waiting list for housing in [facility] and will remain at her daughter's house until a unit becomes available.  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  Pt receiving SSI.  Pt reports at present she has all of the supports she needs.  Pt provided w/ contact number for CSW should needs arise after returning to [facility].        Follow Up Plan:  Patient will contact CSW with any support or resource needs    Debra JONELLE Manna, LCSW Clinical Social Worker [facility]    Patient is participating in a Managed Medicaid Plan:  Yes

## 2024-09-09 ENCOUNTER — Other Ambulatory Visit

## 2025-02-20 ENCOUNTER — Inpatient Hospital Stay: Admitting: Internal Medicine
